# Patient Record
Sex: Female | Born: 1970 | Race: White | Hispanic: Yes | Marital: Married
Health system: Northeastern US, Academic
[De-identification: ages and names within clinical notes are randomized; demographics above are authoritative.]

---

## 2019-08-19 ENCOUNTER — Inpatient Hospital Stay: Admit: 2019-08-19 | Discharge: 2019-08-19 | Payer: MEDICAID

## 2019-08-19 DIAGNOSIS — Z20828 Contact with and (suspected) exposure to other viral communicable diseases: Secondary | ICD-10-CM

## 2019-08-19 DIAGNOSIS — Z20822 Contact with and (suspected) exposure to covid-19: Secondary | ICD-10-CM

## 2019-08-20 LAB — SARS COV-2 (COVID-19) RNA: BKR SARS-COV-2 RNA (COVID-19) (YH): NOT DETECTED

## 2019-09-19 ENCOUNTER — Ambulatory Visit: Admit: 2019-09-19 | Payer: MEDICAID

## 2019-09-26 ENCOUNTER — Ambulatory Visit: Admit: 2019-09-26 | Payer: MEDICAID

## 2020-11-19 ENCOUNTER — Emergency Department: Admit: 2020-11-19 | Payer: PRIVATE HEALTH INSURANCE

## 2020-11-19 ENCOUNTER — Inpatient Hospital Stay: Admit: 2020-11-19 | Discharge: 2020-11-19 | Payer: PRIVATE HEALTH INSURANCE | Attending: Emergency Medicine

## 2020-11-19 ENCOUNTER — Encounter: Admit: 2020-11-19 | Payer: PRIVATE HEALTH INSURANCE

## 2020-11-19 DIAGNOSIS — R0602 Shortness of breath: Secondary | ICD-10-CM

## 2020-11-19 LAB — TSH W/REFLEX TO FT4     (BH GH LMW Q YH): BKR THYROID STIMULATING HORMONE: 1.2 u[IU]/mL

## 2020-11-19 LAB — TROPONIN T HIGH SENSITIVITY, 3 HOUR (BH GH LMW YH)
BKR TROPONIN T HS 1 HOUR DELTA FROM 0 HOUR ON 3HR: 0 ng/L
BKR TROPONIN T HS 3 HOUR DELTA FROM 0 HOUR: 1 ng/L
BKR TROPONIN T HS 3 HOUR: 15 ng/L — ABNORMAL HIGH

## 2020-11-19 LAB — CBC WITH AUTO DIFFERENTIAL
BKR WAM ABSOLUTE IMMATURE GRANULOCYTES.: 0.02 x 1000/ÂµL (ref 0.00–0.30)
BKR WAM ABSOLUTE LYMPHOCYTE COUNT.: 1.93 x 1000/ÂµL (ref 0.60–3.70)
BKR WAM ABSOLUTE NRBC (2 DEC): 0 x 1000/ÂµL (ref 0.00–1.00)
BKR WAM ANALYZER ANC: 5.21 x 1000/ÂµL (ref 2.00–7.60)
BKR WAM BASOPHIL ABSOLUTE COUNT.: 0.05 x 1000/ÂµL (ref 0.00–1.00)
BKR WAM BASOPHILS: 0.6 % (ref 0.0–1.4)
BKR WAM EOSINOPHIL ABSOLUTE COUNT.: 0.21 x 1000/ÂµL (ref 0.00–1.00)
BKR WAM EOSINOPHILS: 2.7 % (ref 0.0–5.0)
BKR WAM HEMATOCRIT (2 DEC): 43.2 % (ref 35.00–45.00)
BKR WAM HEMOGLOBIN: 14.3 g/dL (ref 11.7–15.5)
BKR WAM IMMATURE GRANULOCYTES: 0.3 % (ref 0.0–1.0)
BKR WAM LYMPHOCYTES: 24.5 % (ref 17.0–50.0)
BKR WAM MCH (PG): 28.1 pg (ref 27.0–33.0)
BKR WAM MCHC: 33.1 g/dL (ref 31.0–36.0)
BKR WAM MCV: 85 fL (ref 80.0–100.0)
BKR WAM MONOCYTE ABSOLUTE COUNT.: 0.47 x 1000/ÂµL (ref 0.00–1.00)
BKR WAM MONOCYTES: 6 % (ref 4.0–12.0)
BKR WAM MPV: 9.6 fL (ref 8.0–12.0)
BKR WAM NEUTROPHILS: 65.9 % (ref 39.0–72.0)
BKR WAM NUCLEATED RED BLOOD CELLS: 0 % (ref 0.0–1.0)
BKR WAM PLATELETS: 396 x1000/ÂµL (ref 150–420)
BKR WAM RDW-CV: 14 % (ref 11.0–15.0)
BKR WAM RED BLOOD CELL COUNT.: 5.08 M/ÂµL (ref 4.00–6.00)
BKR WAM WHITE BLOOD CELL COUNT: 7.9 x1000/ÂµL (ref 4.0–11.0)

## 2020-11-19 LAB — COMPREHENSIVE METABOLIC PANEL
BKR A/G RATIO: 1.2 (ref 1.0–2.2)
BKR ALANINE AMINOTRANSFERASE (ALT): 28 U/L (ref 10–35)
BKR ALBUMIN: 4.4 g/dL (ref 3.6–4.9)
BKR ALKALINE PHOSPHATASE: 79 U/L (ref 9–122)
BKR ANION GAP: 14 (ref 7–17)
BKR ASPARTATE AMINOTRANSFERASE (AST): 31 U/L (ref 10–35)
BKR AST/ALT RATIO: 1.1
BKR BILIRUBIN TOTAL: 0.7 mg/dL (ref <=1.2)
BKR BLOOD UREA NITROGEN: 9 mg/dL (ref 6–20)
BKR BUN / CREAT RATIO: 15.8 (ref 8.0–23.0)
BKR CALCIUM: 9.8 mg/dL (ref 8.8–10.2)
BKR CHLORIDE: 101 mmol/L (ref 98–107)
BKR CO2: 22 mmol/L (ref 20–30)
BKR CREATININE: 0.57 mg/dL (ref 0.40–1.30)
BKR EGFR (AFR AMER): >60 mL/min/{1.73_m2} (ref >60)
BKR EGFR (NON AFRICAN AMERICAN): >60 mL/min/{1.73_m2} (ref >60)
BKR GLOBULIN: 3.6 g/dL — ABNORMAL HIGH (ref 2.3–3.5)
BKR GLUCOSE: 88 mg/dL (ref 70–100)
BKR POTASSIUM: 3.9 mmol/L (ref 3.3–5.3)
BKR PROTEIN TOTAL: 8 g/dL (ref 6.6–8.7)
BKR SODIUM: 137 mmol/L (ref 136–144)

## 2020-11-19 LAB — TROPONIN T HIGH SENSITIVITY, 1 HOUR WITH REFLEX (BH GH LMW YH)
BKR TROPONIN T HS 1 HOUR DELTA FROM 0 HOUR: 0 ng/L
BKR TROPONIN T HS 1 HOUR: 14 ng/L — ABNORMAL HIGH

## 2020-11-19 LAB — LYME ANTIBODIES W/RFLX TO CONFIRM (MODIFIED TWO-TIER TESTING)
BKR LYME TOTAL ANTIBODY INTERPRETATION: NEGATIVE
BKR LYME TOTAL ANTIBODY: 0.05 {index}

## 2020-11-19 LAB — TROPONIN T HIGH SENSITIVITY, 0 HOUR BASELINE WITH REFLEX (BH GH LMW YH): BKR TROPONIN T HS 0 HOUR BASELINE: 14 ng/L — ABNORMAL HIGH

## 2020-11-19 LAB — NT-PROBNPE: BKR B-TYPE NATRIURETIC PEPTIDE, PRO (PROBNP): 70.3 pg/mL (ref <125.0)

## 2020-11-19 LAB — PROTIME AND INR
BKR INR: 1.15 — ABNORMAL HIGH (ref 0.87–1.13)
BKR PROTHROMBIN TIME: 12 s (ref 9.5–12.1)

## 2020-11-19 LAB — MAGNESIUM: BKR MAGNESIUM: 1.9 mg/dL (ref 1.7–2.4)

## 2020-11-19 NOTE — ED Notes
2:40 PM Blood work drawn and sent to lab. No IV access. Per Dr Adine Madura, 1 hr trop not needed if baseline negative. Pt moved to [facility].

## 2020-11-19 NOTE — ED Notes
6:01 PM pt sent to [place]

## 2020-11-19 NOTE — ED Triage Note
1:36 PMPt was seen by me in ED triage. Pt will need further medical care. Initial studies ordered pending re-evaluation by the next ED medical provider.Here with co L facial pain now with R facial pain, atraumatic. Some sob without chest pain. gcs is 15. Speech and language are fluent. No ataxia. Gait is normal. No focal weakness or sensory defects.Riley Lam, MD RDMS FACEPAttending PhysicianDepartment of Emergency Medicine

## 2020-11-19 NOTE — ED Notes
2:51 PM Pt presents to ED with c/o SOB and facial numbness since October. Pt states she was dx with viral PNA in October and never got better. Pt reporting SOB on exertion, generalized body aches, facial numbness throughout face, facial pain on right side with blurred vision to right eye, and chest tightness. Pt rates pain 7/10. Pt denies N/V/D, fever/chills, CP. Pt alert, in no acute distress, airway patent, respirations equal and unlabored, skin warm and dry. Pending provider Lamont Dowdy, RN

## 2020-11-20 NOTE — ED Provider Notes
HistoryChief Complaint Patient presents with ? Shortness of Breath   SOB, worse on exertion. Also c/o facial numbness x November, pain to right side of face x 3 weeks.    HPI  this 50 year old female presents with ongoing shortness of breath without chest pain which has been present since she had an episode in October of 2021 while in [place] of a pneumonia that she was told was likely viral but was treated with antibiotics.  At that time it was thought that this was not a COVID pneumonia.  She has never really recovered from that episode.  At the present time she has shortness of breath along with a minimally productive cough which is worse when she has mild exertion.  She sleeps with 1 pillow and occasionally wakes up in the middle of the night short of breath.  She denies any fever or chills.  She does not have any chest pain.  She does not have any ankle swelling.  She also has some concomitant right facial pain that seems to be worse when she lies on her right face.  She denies any tooth pain.  She does have some pain inside her right posterior nostril.  She denies any visual symptoms.  She denies any fever or chills.  She has seen ENT for this without any resolution to her symptoms.  She does not smoke does not drink and has no known cardiovascular or pulmonary disease in her past medical history.  She does not currently have a local primary care physician since she has just moved back to this area recently.No past medical history on file.No past surgical history on file.No family history on file.Social History Socioeconomic History ? Marital status: Married ED Other Social History E-cigarette/Vaping Substances E-cigarette/Vaping Devices Review of SystemsA complete review of systems is negative except as noted in the history of present illness Physical ExamED Triage Vitals [11/19/20 1328]BP: (!) 135/96Pulse: (!) 95Pulse from  O2 sat: n/aResp: 20Temp: 97.6 ?F (36.4 ?C)Temp src: TemporalSpO2: 99 % BP 136/85  - Pulse 89  - Temp 97.6 ?F (36.4 ?C) (Temporal)  - Resp 20  - Wt 79.4 kg (175 lb 0.7 oz)  - SpO2 99% Physical Exam  general-no acute distress, awake alert and oriented.  Vital signs are noted to be completely within normal limits.  Patient is speaking clear [place] and in full sentences without any obvious respiratory distress.  Head-atraumatic and normocephalic.  Eyes-PERL, EOMI.  Face is symmetrical.  There is some mild tenderness to percussion over the right malar area.  The nose is normal.  The mouth is normal without any redness or tooth percussion tenderness.  Neck half-no gym JVD or lymphadenopathy.  Chest-clear to auscultation except for some left mid posterior chest fine rales.  There is no wheezing.  Heart-regular heart sounds without murmurs rubs or gallops.  Abdomen-soft nontender no organomegaly.  Extremities-no ankle edema no calf tenderness.ProceduresProcedures ED COURSEInterpreted by ED Provider: labs, ECG, x-ray, [place] scan and pulse oximetryECG interpreted by attending? interpreted by ED physicianRate: normalRhythm: sinus rhythm.ECG compared to previous: not compared with previous ECG.S-T segments: normal.Interpretation: normal ECG.Patient Reevaluation: 50 year old female presents with a 9 month history of continuing shortness of breath especially with mild exertion following a episode in October 2021 of a pneumonia that was diagnosed as viral but treated as a bacterial with antibiotics.  She has no chest pain with this.  She has no fever chills.  She does have a mild cough.  She also has some pain in  her right malar area that is worse when she lies on it.  She has a relatively benign examination with normal vital signs but some tenderness over the right malar area without redness as well as some left mid lung field inspiratory rales.  She shows no other signs of any pulmonary edema which is what I believe that she has clinically.  Her chest x-ray shows her to have some mild pulmonary edema without any cardiac enlargement.  Her CBC is normal.  Clinically I believe that her next steps would be to see a pulmonologist as well as a cardiologist to better elucidate the cause for her symptoms which I believe are likely secondary to the viral pneumonia she had 9 months ago.The Bernie scan of the head and neck which were ordered at triage are still not done.  The troponins which were ordered are both at 14 and so do not show any acute cardiac ischemia.  I believe that this patient does require referral to a pulmonologist and to a cardiologist upon discharge.  I will plan to pass this patient on to [facility].  The proBNP is also pending at this time.Clinical Impressions as of 11/19/20 2221 Shortness of breath  ED DispositionDischarge Berton Lan, MD07/16/22 1555 Berton Lan, MD07/16/22 1659Douglas Sharalyn Ink, MD:  Received in sign-out.  [place] scan unremarkable.  Patient discharged home. Juliann Mule, MD07/16/22 2222

## 2020-11-21 ENCOUNTER — Telehealth: Admit: 2020-11-21 | Payer: PRIVATE HEALTH INSURANCE

## 2020-11-21 NOTE — Telephone Encounter
Patient saw [facility] PCP 2021. That office most recently reached out to patient in may for appt.Has patient contacted this office?Per protocol a primary care provider must refer to speciality. ED can refer only when speciality consulted while patient was in ED and recommended the outpatient follow up.

## 2020-11-21 NOTE — Telephone Encounter
Niece Leta Jungling [phone]) is calling looking for an ED follow up for the following. Has medical insurance, states does not have a primary Dr. No referral entered. States her breathing is stable as long as she stays in the bed. Please advise if she can be scheduled?Follow-Ups	Shortness of breathClinical impression 	Shortness of Breath  1	Follow up with [facility] Emergency Department (Emergency Medicine); As needed2	Schedule an appointment with [facility] Primary Care Cardiac (Cardiovascular Disease) in 2 days (11/21/2020)3	Schedule an appointment with [facility] Pulmonary (Pulmonary Disease) in 2 days (11/21/2020

## 2021-03-12 ENCOUNTER — Inpatient Hospital Stay: Admit: 2021-03-12 | Discharge: 2021-03-12 | Payer: PRIVATE HEALTH INSURANCE

## 2021-03-12 ENCOUNTER — Emergency Department: Admit: 2021-03-12 | Payer: PRIVATE HEALTH INSURANCE

## 2021-03-12 DIAGNOSIS — R059 Cough, unspecified: Secondary | ICD-10-CM

## 2021-03-12 DIAGNOSIS — R0789 Other chest pain: Secondary | ICD-10-CM

## 2021-03-12 DIAGNOSIS — R0602 Shortness of breath: Secondary | ICD-10-CM

## 2021-03-12 LAB — BASIC METABOLIC PANEL
BKR ANION GAP: 11 (ref 7–17)
BKR BLOOD UREA NITROGEN: 9 mg/dL (ref 6–20)
BKR BUN / CREAT RATIO: 15.8 (ref 8.0–23.0)
BKR CALCIUM: 9.5 mg/dL (ref 8.8–10.2)
BKR CHLORIDE: 104 mmol/L (ref 98–107)
BKR CO2: 24 mmol/L (ref 20–30)
BKR CREATININE: 0.57 mg/dL (ref 0.40–1.30)
BKR EGFR, CREATININE (CKD-EPI 2021): >60 mL/min/{1.73_m2} (ref >=60)
BKR GLUCOSE: 80 mg/dL (ref 70–100)
BKR POTASSIUM: 4.1 mmol/L (ref 3.3–5.3)
BKR SODIUM: 139 mmol/L (ref 136–144)

## 2021-03-12 LAB — CBC WITH AUTO DIFFERENTIAL
BKR WAM ABSOLUTE IMMATURE GRANULOCYTES.: 0.02 x 1000/ÂµL (ref 0.00–0.30)
BKR WAM ABSOLUTE LYMPHOCYTE COUNT.: 1.82 x 1000/ÂµL (ref 0.60–3.70)
BKR WAM ABSOLUTE NRBC (2 DEC): 0 x 1000/ÂµL (ref 0.00–1.00)
BKR WAM ANALYZER ANC: 3.78 x 1000/ÂµL (ref 2.00–7.60)
BKR WAM BASOPHIL ABSOLUTE COUNT.: 0.06 x 1000/ÂµL (ref 0.00–1.00)
BKR WAM BASOPHILS: 0.9 % (ref 0.0–1.4)
BKR WAM EOSINOPHIL ABSOLUTE COUNT.: 0.28 x 1000/ÂµL (ref 0.00–1.00)
BKR WAM EOSINOPHILS: 4.4 % (ref 0.0–5.0)
BKR WAM HEMATOCRIT (2 DEC): 42.1 % (ref 35.00–45.00)
BKR WAM HEMOGLOBIN: 13.5 g/dL (ref 11.7–15.5)
BKR WAM IMMATURE GRANULOCYTES: 0.3 % (ref 0.0–1.0)
BKR WAM LYMPHOCYTES: 28.3 % (ref 17.0–50.0)
BKR WAM MCH (PG): 27.1 pg (ref 27.0–33.0)
BKR WAM MCHC: 32.1 g/dL (ref 31.0–36.0)
BKR WAM MCV: 84.5 fL (ref 80.0–100.0)
BKR WAM MONOCYTE ABSOLUTE COUNT.: 0.47 x 1000/ÂµL (ref 0.00–1.00)
BKR WAM MONOCYTES: 7.3 % (ref 4.0–12.0)
BKR WAM MPV: 9.8 fL (ref 8.0–12.0)
BKR WAM NEUTROPHILS: 58.8 % (ref 39.0–72.0)
BKR WAM NUCLEATED RED BLOOD CELLS: 0 % (ref 0.0–1.0)
BKR WAM PLATELETS: 410 x1000/ÂµL (ref 150–420)
BKR WAM RDW-CV: 14.8 % (ref 11.0–15.0)
BKR WAM RED BLOOD CELL COUNT.: 4.98 M/ÂµL (ref 4.00–6.00)
BKR WAM WHITE BLOOD CELL COUNT: 6.4 x1000/ÂµL (ref 4.0–11.0)

## 2021-03-12 LAB — INFLUENZA A+B/RSV BY RT-PCR
BKR INFLUENZA A: NEGATIVE
BKR INFLUENZA B: NEGATIVE
BKR RESPIRATORY SYNCYTIAL VIRUS: NEGATIVE

## 2021-03-12 LAB — TROPONIN T HIGH SENSITIVITY, 0 HOUR BASELINE WITH REFLEX (BH GH LMW YH): BKR TROPONIN T HS 0 HOUR BASELINE: 16 ng/L — ABNORMAL HIGH

## 2021-03-12 LAB — NT-PROBNPE: BKR B-TYPE NATRIURETIC PEPTIDE, PRO (PROBNP): 56 pg/mL (ref <125.0)

## 2021-03-12 LAB — SARS COV-2 (COVID-19) RNA: BKR SARS-COV-2 RNA (COVID-19) (YH): POSITIVE — AB

## 2021-03-12 LAB — D-DIMER, QUANTITATIVE: BKR D-DIMER: 1.78 mg{FEU}/L — ABNORMAL HIGH (ref <=0.50)

## 2021-03-12 MED ORDER — IOHEXOL 350 MG IODINE/ML INTRAVENOUS SOLUTION
350 mg iodine/mL | Freq: Once | INTRAVENOUS | Status: CP | PRN
Start: 2021-03-12 — End: ?
  Administered 2021-03-12: 22:00:00 350 mL via INTRAVENOUS

## 2021-03-12 MED ORDER — SODIUM CHLORIDE 0.9 % LARGE VOLUME SYRINGE FOR AUTOINJECTOR
Freq: Once | INTRAVENOUS | Status: CP | PRN
Start: 2021-03-12 — End: ?
  Administered 2021-03-12: 22:00:00 via INTRAVENOUS

## 2021-03-12 MED ORDER — ALBUTEROL SULFATE HFA 90 MCG/ACTUATION AEROSOL INHALER
90 mcg/actuation | Freq: Four times a day (QID) | RESPIRATORY_TRACT | Status: CP | PRN
Start: 2021-03-12 — End: ?
  Administered 2021-03-13: 01:00:00 90 mcg/actuation via RESPIRATORY_TRACT

## 2021-03-12 MED ORDER — METHYLPREDNISOLONE SOD SUCC (PF) 125 MG/2 ML SOLUTION FOR INJECTION
125 mg/2 mL | Freq: Once | INTRAVENOUS | Status: CP
Start: 2021-03-12 — End: ?
  Administered 2021-03-12: 18:00:00 125 mL via INTRAVENOUS

## 2021-03-12 MED ORDER — IPRATROPIUM 0.5 MG-ALBUTEROL 3 MG (2.5 MG BASE)/3 ML NEBULIZATION SOLN
0.5 mg-3 mg(2.5 mg base)/3 mL | Freq: Once | RESPIRATORY_TRACT | Status: CP
Start: 2021-03-12 — End: ?
  Administered 2021-03-12: 18:00:00 0.5 mL via RESPIRATORY_TRACT

## 2021-03-12 MED ORDER — MOLNUPIRAVIR (EUA) CAPSULE (ED TAKE HOME)
Status: CP
Start: 2021-03-12 — End: ?

## 2021-03-12 MED ORDER — SODIUM CHLORIDE 0.9 % BOLUS (NEW BAG)
0.9 % | Freq: Once | INTRAVENOUS | Status: CP
Start: 2021-03-12 — End: ?
  Administered 2021-03-12: 18:00:00 0.9 mL/h via INTRAVENOUS

## 2021-03-12 MED ORDER — PREDNISONE 50 MG TABLET
50 mg | ORAL_TABLET | Freq: Every day | ORAL | 1 refills | Status: AC
Start: 2021-03-12 — End: ?

## 2021-03-12 MED ORDER — MOLNUPIRAVIR 200 MG CAPSULE (EUA)
200 mg | ORAL_CAPSULE | Freq: Two times a day (BID) | ORAL | 1 refills | Status: AC
Start: 2021-03-12 — End: ?

## 2021-03-13 DIAGNOSIS — E669 Obesity, unspecified: Secondary | ICD-10-CM

## 2021-03-13 DIAGNOSIS — R0789 Other chest pain: Secondary | ICD-10-CM

## 2021-03-13 DIAGNOSIS — E785 Hyperlipidemia, unspecified: Secondary | ICD-10-CM

## 2021-03-13 DIAGNOSIS — U071 COVID-19: Principal | ICD-10-CM

## 2021-03-13 DIAGNOSIS — Z6833 Body mass index (BMI) 33.0-33.9, adult: Secondary | ICD-10-CM

## 2021-03-13 DIAGNOSIS — Z8616 Personal history of COVID-19: Secondary | ICD-10-CM

## 2021-03-13 LAB — TROPONIN T HIGH SENSITIVITY, 1 HOUR WITH REFLEX (BH GH LMW YH)
BKR TROPONIN T HS 1 HOUR DELTA FROM 0 HOUR: -7 ng/L
BKR TROPONIN T HS 1 HOUR: 9 ng/L

## 2021-03-13 NOTE — Telephone Encounter
Message from ED provider requesting pt to be called to see how she is feeling and assure pulmonary follow up. Patient has an apt with her PCP in 2 days and will have her send referral as ED referral was not accepted. She was appreciative of the follow up call and states she will definitely follow up with pulmonary.

## 2021-03-13 NOTE — ED Notes
8:29 PM dc in stable condition with husband by PA. Noted to ambulate without any difficulty. Was given to go meds by previous RN and given albuterol inhaler to go by me.

## 2021-03-13 NOTE — Discharge Instructions
Discharge Instructions for Teresa Watson:You were seen at [facility] Emergency Department on 03/12/2021 for shortness of breath, chest tightness.If you were prescribed any medications, be sure to read the medication insert and call your primary care doctor or the emergency department with any questions or concerns.No adjustments were made to your home medication regimen.You will need to follow up with your primary care doctor within a week.  Call your doctor's office as soon as possible to schedule an appointment and to let your doctor know that your condition required a visit to the Emergency Room.Return to the emergency department immediately if you develop any of the following symptoms:New or worsening chest pain and/or shortness of breathConfusion or seizuresDizziness or loss of consciousnessFever above 103 degrees that you cannot control with over-the-counter medication (such as ibuprofen or Tylenol)Excruciating pain, numbness, or weaknessUncontrollable vomitingWorsening of your current symptomsThoughts of suicide or of hurting yourself or anyone elseAny other symptoms that you find concerningIf you have any questions about the care you received or what to do next, please call the emergency department follow-up nurse at one of the following numbers. There are follow-up nurses available Monday through Friday, from 7:00 a.m. until 3:30 p.m. to answer any questions you have after you have been discharged.[facility]: [phone] [place] [place] [facility]: [phone] [facility]: [phone] Hospital: [phone] you need a primary care physician, the following physicians may be accepting new patients; please call to see if an appointment is available: [facility]: [address] [place], [place] - [phone] [address] [address], [place], [place] - [phone] [facility] [place], [place], [place] - [address] [place], [place] - [phone] [facility] [address], [place], [place] - [phone], [place], [place] - [phone] [facility] [address] [place], [place] - [phone] [address] [place], [place] - [phone] [facility] (dentistry [address] [place], [place] - [phone] [facility] [phone] (call this number and a receptionist will help connect you with an appropriate provider; you can also call this number to get matched with a specialist, if applicable)[facility] GroupMultiple locations - [phone] also be booked online: [employer] can also use the Find a Doctor tool on the [facility] website:[email or web address] -> Find a Doctor -> Specialty: Primary Care/Internal Medicine -> Click Accepting New Patients -> Search If applicable and available at the time of your discharge, a copy of your laboratory tests and imaging reports have been included in this packet. Some of your results may also be available on MyChart, if you are enrolled; if you are not enrolled in MyChart, a temporary access code is included in this packet so you can set up your account. You can also obtain any pending results by phone once they are available by calling the follow-up nurse at the number above. However, if you would like to request copies of the original images or any other medical records, you may do so by calling [phone] (8:00 a.m. to 4:30 p.m., Monday through Friday). You may need to fill out authorization paperwork and/or pick up the images. There may also be a printing fee for some records. Thank you for trusting your care to [place].  Do not hesitate to return if you feel the need.

## 2021-03-26 NOTE — ED Provider Notes
HistoryChief Complaint Patient presents with ? Shortness of Breath  50 year old female history of hyperlipidemia coming to the emergency department with worsening shortness of breath on exertion and chest tightness over the last 2 days, patient states she has been experiencing the symptoms for the last 1 year ever since she had a viral pneumonia that was presumed to be COVID at the time.  Patient was told she should be seen by a pulmonologist but had moved to [place] and was not seen- during her time in [place] she was evaluated and referred to a cardiologist who did a echo and stress test 2 months ago and she was told that for her age everything appeared to be normal.  Patient now works remotely and has moved back to [place] in order to be seen by pulmonology and establish herself with a PMD. Arrived back in [place] on Friday which is when her symptoms worsened, patient states for the last 1 year she has not been able to walk more than 1 block due to shortness of breath and chest tightness, sleeps with several pillows on her side, if she tries to sleep flat she is too short of breath.  Wakes up at times coughing, sometimes with yellow sputum, does not wake up with chest tightness. Reports about 6 months of joint pains to her wrists and ankles, sometimes with swelling at the ankles, has not been seen by a rheumatologist or had an autoimmune workup done yetThe history is provided by the patient. No language interpreter was used. Shortness of BreathTiming:  IntermittentProgression:  WorseningChronicity:  RecurrentContext: activity and URI  Relieved by:  RestWorsened by:  Activity and exertionIneffective treatments:  None triedAssociated symptoms: chest pain, cough and sputum production  Associated symptoms: no abdominal pain, no claudication, no diaphoresis, no ear pain, no fever, no headaches, no hemoptysis, no neck pain, no PND, no rash, no sore throat, no syncope, no swollen glands, no vomiting and no wheezing  Risk factors: obesity  Risk factors: no recent alcohol use, no family hx of DVT, no hx of cancer, no hx of PE/DVT, no oral contraceptive use, no prolonged immobilization, no recent surgery and no tobacco use   No past medical history on file.No past surgical history on file.No family history on file.Social History Socioeconomic History ? Marital status: Married ED Other Social History E-cigarette/Vaping Substances E-cigarette/Vaping Devices Review of Systems Constitutional: Negative for diaphoresis and fever. HENT: Negative for ear pain and sore throat.  Respiratory: Positive for cough, sputum production and shortness of breath. Negative for hemoptysis and wheezing.  Cardiovascular: Positive for chest pain. Negative for claudication, syncope and PND. Gastrointestinal: Negative for abdominal pain and vomiting. Musculoskeletal: Negative for neck pain. Skin: Negative for rash. Neurological: Negative for headaches. All other systems reviewed and are negative. Physical ExamED Triage Vitals [03/12/21 1143]BP: (!) 137/95Pulse: (!) 96Pulse from  O2 sat: n/aResp: (!) 23Temp: 98.3 ?F (36.8 ?C)Temp src: OralSpO2: 98 % BP (!) 148/79  - Pulse (!) 92  - Temp 98.1 ?F (36.7 ?C)  - Resp 16  - Ht 5' (1.524 m)  - Wt 77.1 kg  - SpO2 98%  - BMI 33.20 kg/m? Physical ExamVitals and nursing note reviewed. Constitutional:     General: She is not in acute distress.   Appearance: Normal appearance. She is normal weight. She is not ill-appearing, toxic-appearing or diaphoretic. HENT:    Head: Normocephalic and atraumatic.    Right Ear: External ear normal.    Left Ear: External ear normal.  Nose: Nose normal.    Mouth/Throat:    Mouth: Mucous membranes are moist.    Pharynx: Oropharynx is clear. Eyes:    Conjunctiva/sclera: Conjunctivae normal. Cardiovascular: Rate and Rhythm: Normal rate and regular rhythm.    Pulses: Normal pulses.    Heart sounds: Normal heart sounds. No murmur heard.  No friction rub. No gallop. Pulmonary:    Effort: Pulmonary effort is normal. No respiratory distress.    Breath sounds: No stridor. Wheezing and rales present. No rhonchi.    Comments: Scant intermittent wheezing noted on R mid lungRales noted at bilat bases Chest:    Chest wall: No tenderness. Abdominal:    General: Abdomen is flat. Bowel sounds are normal. There is no distension.    Palpations: Abdomen is soft.    Tenderness: There is no abdominal tenderness. There is no right CVA tenderness or left CVA tenderness. Musculoskeletal:       General: No swelling or tenderness. Normal range of motion.    Cervical back: Normal range of motion and neck supple.    Right lower leg: No edema.    Left lower leg: No edema.    Comments: dp pulses 2+ bilat  Skin:   General: Skin is warm and dry.    Capillary Refill: Capillary refill takes less than 2 seconds. Neurological:    General: No focal deficit present.    Mental Status: She is alert. Psychiatric:       Mood and Affect: Mood normal.       Behavior: Behavior normal.  ProceduresProceduresResident/APP ZOX:[id number] & Plan50 year old female here with ongoing dyspnea on exertion with chest tightness worse over the last 2 days, on exam with scant wheezing and rales at bases, ambulatory SaO2 96%Ddx:  CHF, ACS, pneumonia, viral illness/COVID, PEP:  Labs, EKG, CXR,Dimer positive, will obtain [place] PE studyDr. Modesto Charon avail for consultation At discharge patient aware she is COVID positive, gave very strict return precautions, sent prednisone to her pharmacy, albuterol pump handed to patient at discharge, referrals placed for pulmonology and internal medicineED COURSEPatient Reevaluation: ED Attestation:PA/APRN: Face to face evaluation was performed by me in collaboration with the Advanced Practice Provider to assess for significant health threats. I personally performed substantive history, exam, and/or MDMOn evaluation:50 year old female no known prior medical history (except for mild hyperlipidemia) reporting months of intermittent chest tightness and shortness of breath, worsened over the past 2 days along with coughing and wheezing; nonsmoker no known history of asthma but has had issues since COVID infection in 2020.  States that she recently moved back from [place] has not established PCP.  Noted to have diffuse scant wheezing throughout and some rales in the right lung base.  Likely has some previously undiagnosed asthma along with reactive airway disease and viral URI, given age and risk factors will obtain troponin, D-dimer, viral swab, albuterol and Solu-Medrol, if workup negative likely discharge with outpatient PCP follow-up and steroid courseFor continuity of care, Denae Zulueta is being transitioned to the emergency annex for care by APP.Sinclair Grooms WongClinical Impressions as of 03/12/21 2013 COVID  ED DispositionDischarge McNiff, Lucilla Lame, PA11/06/22 1345 McNiff, Lucilla Lame, PA11/06/22 1648 McNiff, Lucilla Lame, PA11/06/22 2013 Britta Mccreedy, MD11/19/22 2300

## 2021-03-29 ENCOUNTER — Inpatient Hospital Stay: Admit: 2021-03-29 | Discharge: 2021-03-29 | Payer: PRIVATE HEALTH INSURANCE

## 2021-03-29 DIAGNOSIS — U071 COVID-19: Secondary | ICD-10-CM

## 2021-03-29 DIAGNOSIS — J849 Interstitial pulmonary disease, unspecified: Secondary | ICD-10-CM

## 2021-03-29 LAB — RHEUMATOID FACTOR: BKR RHEUMATOID FACTOR: <10 IU/mL (ref <14.0)

## 2021-03-29 LAB — SEDIMENTATION RATE (ESR): BKR SEDIMENTATION RATE, ERYTHROCYTE: 72 mm/hr — ABNORMAL HIGH (ref 0–20)

## 2021-03-29 MED ORDER — ALBUTEROL INHL
RESPIRATORY_TRACT | 0.00 refills | 8.00000 days | Status: AC | PRN
Start: 2021-03-29 — End: 2021-06-27

## 2021-04-03 LAB — ANA TITER AND PATTERN W/RFLX   (BH GH): BKR ANA TITER: 1:1280 {titer} — AB

## 2021-04-03 LAB — ANA IFA SCREEN W/REFL TO TITER AND PATTERN, IFA: BKR ANTI-NUCLEAR ANTIBODY (ANA): POSITIVE — AB

## 2021-04-05 LAB — DNA ANTIBODY, DOUBLE-STRANDED: BKR DSDNA ANTIBODY: <12.3 IU/mL (ref <30)

## 2021-04-06 ENCOUNTER — Ambulatory Visit: Admit: 2021-04-06 | Payer: PRIVATE HEALTH INSURANCE

## 2021-04-06 ENCOUNTER — Inpatient Hospital Stay: Admit: 2021-04-06 | Discharge: 2021-04-06 | Payer: PRIVATE HEALTH INSURANCE

## 2021-04-06 ENCOUNTER — Encounter: Admit: 2021-04-06 | Payer: PRIVATE HEALTH INSURANCE

## 2021-04-06 DIAGNOSIS — M2550 Pain in unspecified joint: Secondary | ICD-10-CM

## 2021-04-06 DIAGNOSIS — R768 Other specified abnormal immunological findings in serum: Secondary | ICD-10-CM

## 2021-04-06 DIAGNOSIS — J849 Interstitial pulmonary disease, unspecified: Secondary | ICD-10-CM

## 2021-04-06 DIAGNOSIS — M609 Myositis, unspecified: Secondary | ICD-10-CM

## 2021-04-06 DIAGNOSIS — R0602 Shortness of breath: Secondary | ICD-10-CM

## 2021-04-06 DIAGNOSIS — R700 Elevated erythrocyte sedimentation rate: Secondary | ICD-10-CM

## 2021-04-06 LAB — SEDIMENTATION RATE (ESR): BKR SEDIMENTATION RATE, ERYTHROCYTE: 66 mm/hr — ABNORMAL HIGH (ref 0–20)

## 2021-04-06 LAB — C-REACTIVE PROTEIN     (CRP): BKR C-REACTIVE PROTEIN, HIGH SENSITIVITY: 6.1 mg/L — ABNORMAL HIGH

## 2021-04-06 MED ORDER — PREDNISONE 10 MG TABLET
10 | ORAL_TABLET | Freq: Every day | ORAL | 1 refills | 12.00000 days | Status: AC
Start: 2021-04-06 — End: ?

## 2021-04-07 LAB — ANCA SCREEN, WITH REFLEX TO TITER: BKR ANCA SCREEN: NEGATIVE

## 2021-04-07 LAB — C3 + C4 COMPLEMENT COMPONENT     (BH GH Q)
BKR C3 COMPLEMENT: 156 mg/dL (ref 90–180)
BKR C4 COMPLEMENT: 33 mg/dL (ref 10–40)

## 2021-04-07 LAB — CK     (BH GH L LMW YH): BKR CREATINE KINASE TOTAL: 77 U/L (ref 11–204)

## 2021-04-07 LAB — ANA TITER AND PATTERN W/RFLX   (BH GH): BKR ANA TITER: 1:1280 {titer} — AB

## 2021-04-07 LAB — ANGIOTENSIN CONVERTING ENZYME: BKR ANGIOTENSIN CONVERTING ENZYME: 57 U/L (ref 12–82)

## 2021-04-07 LAB — RHEUMATOID FACTOR: BKR RHEUMATOID FACTOR: <10 IU/mL (ref <14.0)

## 2021-04-07 LAB — ANA IFA SCREEN W/REFL TO TITER AND PATTERN, IFA: BKR ANTI-NUCLEAR ANTIBODY (ANA): POSITIVE — AB

## 2021-04-08 LAB — CYCLIC CITRUL PEPTIDE ANTIBODY, IGG: BKR CCP AB, IGG: 0.8 EliA U/mL (ref <7.0)

## 2021-04-08 LAB — BETA-2 GLYCOPROTEIN 1 ANTIBODY, IGG: BKR B2G IGG: 1 U/mL (ref <7.0)

## 2021-04-08 LAB — DNA ANTIBODY, DOUBLE-STRANDED: BKR DSDNA ANTIBODY: <12.3 IU/mL (ref <30)

## 2021-04-09 LAB — CENTROMERE B ANTIBODY     (BH LMW Q): CENTROMERE B ANTIBODY: <1

## 2021-04-09 LAB — PROTEINASE 3 ANTIBODY: PROTEINASE-3 ANTIBODY: <1 AI (ref <1.0)

## 2021-04-09 LAB — MYELOPEROXIDASE ANTIBODY: MYELOPEROXIDASE AB: <1 AI (ref <1.0)

## 2021-04-10 LAB — SCLERODERMA (SCL-70) ANTIBODY: SCL-70 ANTIBODY: <1

## 2021-04-10 LAB — SJOGRENS AB, SS-A     (BH GH LMW Q YH): SJOGREN'S ANTIBODY (SS-A): >8 — ABNORMAL HIGH

## 2021-04-10 LAB — SMITH/RNP ANTIBODY (BH GH L Q): SM/RNP ANTIBODY: <1

## 2021-04-18 LAB — RNA POLYMERASE III ANTIBODY: RNA POLYMERASE III ANTIBODY, IGG: <20 Units (ref <20)

## 2021-04-18 LAB — TPMT ACTIVITY     (BH LMW Q): TPMT ACTIVITY: 16

## 2021-04-18 MED ORDER — BIOTIN 1,000 MCG CHEWABLE TABLET
1000 | ORAL | 0.00 refills | 60.00000 days | Status: AC
Start: 2021-04-18 — End: 2021-04-18

## 2021-04-18 MED ORDER — PANTOPRAZOLE 20 MG TABLET,DELAYED RELEASE
20 | Freq: Every day | ORAL | 2.00 refills | 90.00000 days | Status: AC
Start: 2021-04-18 — End: 2021-04-18

## 2021-04-18 MED ORDER — FLUTICASONE PROPIONATE 50 MCG/ACTUATION NASAL SPRAY,SUSPENSION
50 | NASAL | 3.00 refills | 30.00000 days | Status: AC
Start: 2021-04-18 — End: 2021-04-18

## 2021-04-18 MED ORDER — MULTI FOR HER ORAL
ORAL | 0.00 refills | 1.00000 days | Status: AC
Start: 2021-04-18 — End: ?

## 2021-04-18 MED ORDER — LEVONORGESTREL 21 MCG/24 HR (UP TO 8 YEARS) 52 MG INTRAUTERINE DEVICE
21 | Freq: Once | INTRAUTERINE | 0.00 refills | 84.00000 days | Status: AC
Start: 2021-04-18 — End: ?

## 2021-04-18 MED ORDER — LORATADINE 10 MG TABLET
10 | ORAL | 0.00 refills | 75.00000 days | Status: AC
Start: 2021-04-18 — End: 2021-04-18

## 2021-04-18 MED ORDER — VITAMIN E 268 MG (400 UNIT) CAPSULE
400 | Freq: Every day | ORAL | 0.00 refills | Status: AC
Start: 2021-04-18 — End: ?

## 2021-04-18 MED ORDER — IPRATROPIUM-ALBUTEROL INHL
RESPIRATORY_TRACT | 3.00 refills | 20.00000 days | Status: AC
Start: 2021-04-18 — End: 2021-04-18

## 2021-04-18 MED ORDER — ERGOCALCIFEROL (VITAMIN D2) 1,250 MCG (50,000 UNIT) CAPSULE
1250 | ORAL | 1.00 refills | 84.00000 days | Status: AC
Start: 2021-04-18 — End: 2021-06-20

## 2021-04-18 MED ORDER — FLANAX ANTACID ORAL
ORAL | 1.00 refills | 20.00000 days | Status: AC | PRN
Start: 2021-04-18 — End: 2021-05-04

## 2021-04-18 MED ORDER — HOME OXYGEN - OUTPATIENT
NASAL | 0.00 refills | Status: AC
Start: 2021-04-18 — End: ?

## 2021-04-18 MED ORDER — FISH OIL 120 MG-180 MG-60 MG-1,200 MG CAPSULE,DELAYED RELEASE
120 | ORAL | 0.00 refills | 90.00000 days | Status: AC
Start: 2021-04-18 — End: 2021-11-10

## 2021-04-19 ENCOUNTER — Inpatient Hospital Stay: Admit: 2021-04-19 | Discharge: 2021-04-19 | Payer: PRIVATE HEALTH INSURANCE

## 2021-04-19 DIAGNOSIS — Z114 Encounter for screening for human immunodeficiency virus [HIV]: Secondary | ICD-10-CM

## 2021-04-19 DIAGNOSIS — Z1159 Encounter for screening for other viral diseases: Secondary | ICD-10-CM

## 2021-04-19 DIAGNOSIS — Z0000 Encounter for general adult medical examination without abnormal findings: Secondary | ICD-10-CM

## 2021-04-19 DIAGNOSIS — E559 Vitamin D deficiency, unspecified: Secondary | ICD-10-CM

## 2021-04-19 LAB — URINE MICROSCOPIC     (BH GH LMW YH)
BKR RBC/HPF INSTRUMENT: 1 /HPF (ref 0–2)
BKR URINE SQUAMOUS EPITHELIAL CELLS, UA (NUMERIC): 16 /HPF — ABNORMAL HIGH (ref 0–5)
BKR WAM LYMPHOCYTES: 16 /HPF — ABNORMAL HIGH (ref 0–5)
BKR WAM MPV: 2 /HPF (ref 0–5)
BKR WBC/HPF INSTRUMENT: 2 /HPF (ref 0–5)

## 2021-04-19 LAB — URINALYSIS-MACROSCOPIC W/REFLEX MICROSCOPIC
BKR BILIRUBIN, UA: NEGATIVE
BKR BLOOD, UA: NEGATIVE M/??L (ref 4.00–6.00)
BKR GLUCOSE, UA: NEGATIVE
BKR KETONES, UA: NEGATIVE
BKR LEUKOCYTE ESTERASE, UA: NEGATIVE mmol/L (ref 3.3–5.3)
BKR NITRITE, UA: NEGATIVE
BKR PH, UA: 5.5 (ref 5.5–7.5)
BKR SPECIFIC GRAVITY, UA: 1.023 (ref 1.005–1.030)
BKR UROBILINOGEN, UA (MG/DL): <2 mg/dL (ref <=2.0)

## 2021-04-19 LAB — COMPREHENSIVE METABOLIC PANEL
BKR A/G RATIO: 1.6 x 1000/??L (ref 1.0–2.2)
BKR ALANINE AMINOTRANSFERASE (ALT): 19 U/L (ref 10–35)
BKR ALKALINE PHOSPHATASE: 79 U/L (ref 9–122)
BKR ASPARTATE AMINOTRANSFERASE (AST): 17 U/L (ref 10–35)
BKR AST/ALT RATIO: 0.9
BKR BILIRUBIN TOTAL: 0.6 mg/dL (ref <=1.2)
BKR BLOOD UREA NITROGEN: 10 mg/dL (ref 6–20)
BKR BUN / CREAT RATIO: 16.1 (ref 8.0–23.0)
BKR CALCIUM: 9.2 mg/dL (ref 8.8–10.2)
BKR CHLORIDE: 103 mmol/L (ref 98–107)
BKR CO2: 26 mmol/L (ref 20–30)
BKR CREATININE: 0.62 mg/dL (ref 0.40–1.30)
BKR EGFR, CREATININE (CKD-EPI 2021): >60 mL/min/{1.73_m2} (ref >=60)
BKR GLOBULIN: 2.8 g/dL (ref 2.3–3.5)
BKR GLUCOSE: 105 mg/dL — ABNORMAL HIGH (ref 70–100)
BKR POTASSIUM: 4.3 mmol/L (ref 3.3–5.3)
BKR PROTEIN TOTAL: 7.2 g/dL (ref 6.6–8.7)
BKR SODIUM: 138 mmol/L (ref 136–144)
BKR WAM BASOPHILS: 0.6 mg/dL (ref 0.0–<=1.2)
BKR WAM EOSINOPHIL ABSOLUTE COUNT.: 0.9 x 1000/??L (ref 0.00–1.00)

## 2021-04-19 LAB — FERRITIN
BKR ANION GAP: 285 ng/mL — ABNORMAL HIGH (ref 13–150)
BKR FERRITIN: 285 ng/mL — ABNORMAL HIGH (ref 13–150)

## 2021-04-19 LAB — HEMOGLOBIN A1C
BKR ESTIMATED AVERAGE GLUCOSE: 117 mg/dL
BKR HEMOGLOBIN A1C: 5.7 % — ABNORMAL HIGH (ref 4.0–5.6)

## 2021-04-19 LAB — CBC WITH AUTO DIFFERENTIAL
BKR ALBUMIN: 1.1 % (ref 0.0–5.0)
BKR WAM ABSOLUTE IMMATURE GRANULOCYTES.: 0.02 x 1000/??L (ref 0.00–0.30)
BKR WAM ABSOLUTE LYMPHOCYTE COUNT.: 2.4 x 1000/??L (ref 0.60–3.70)
BKR WAM ABSOLUTE NRBC (2 DEC): 0 x 1000/??L (ref 0.00–1.00)
BKR WAM ANALYZER ANC: 5.38 x 1000/??L (ref 2.00–7.60)
BKR WAM BASOPHIL ABSOLUTE COUNT.: 0.04 x 1000/??L (ref 0.00–1.00)
BKR WAM EOSINOPHILS: 1.1 % (ref 0.0–5.0)
BKR WAM HEMATOCRIT (2 DEC): 45 % (ref 35.00–45.00)
BKR WAM HEMOGLOBIN: 14.5 g/dL (ref 11.7–15.5)
BKR WAM IMMATURE GRANULOCYTES: 0.2 % (ref 0.0–1.0)
BKR WAM MCH (PG): 28.3 pg (ref 27.0–33.0)
BKR WAM MCHC: 32.2 g/dL (ref 31.0–36.0)
BKR WAM MCV: 87.7 fL (ref 80.0–100.0)
BKR WAM MONOCYTE ABSOLUTE COUNT.: 0.5 x 1000/??L (ref 0.00–1.00)
BKR WAM MONOCYTES: 5.9 % (ref 4.0–12.0)
BKR WAM NEUTROPHILS: 63.8 % (ref 39.0–72.0)
BKR WAM NUCLEATED RED BLOOD CELLS: 0 % (ref 0.0–1.0)
BKR WAM PLATELETS: 426 x1000/??L — ABNORMAL HIGH (ref 150–420)
BKR WAM RDW-CV: 15.6 % — ABNORMAL HIGH (ref 11.0–15.0)
BKR WAM RED BLOOD CELL COUNT.: 5.13 M/??L (ref 4.00–6.00)
BKR WAM WHITE BLOOD CELL COUNT: 8.4 x1000/??L (ref 4.0–11.0)

## 2021-04-19 LAB — FOLATE: BKR FOLATE: 14.4 ng/mL

## 2021-04-19 LAB — VITAMIN B12: BKR VITAMIN B12: 447 pg/mL (ref 232–1245)

## 2021-04-19 LAB — HEPATITIS C AB WITH REFLEX TO HCV PCR
BKR HEPATITIS C ANTIBODY INITIAL RESULT: 0.02 {s_co_ratio}
BKR HEPATITIS C ANTIBODY INTERPRETATION: NONREACTIVE
BKR PROTEIN, UA: 0.02 S/CO

## 2021-04-19 LAB — HIV-1/HIV-2 ANTIBODY/ANTIGEN SCREEN W/REFLEX     (BH GH LMW YH): BKR HIV 1/2 ANTIBODY SCREEN (BH): NONREACTIVE

## 2021-04-19 LAB — VITAMIN D, 25-HYDROXY: BKR VITAMIN D 25-HYDROXY: 37.3 ng/mL (ref 30.0–100.0)

## 2021-04-20 LAB — LIPID PANEL
BKR CHOLESTEROL/HDL RATIO: 4.9 (ref 0.0–5.0)
BKR CHOLESTEROL: 230 mg/dL — ABNORMAL HIGH
BKR HDL CHOLESTEROL: 47 mg/dL (ref >=40)
BKR LDL CHOLESTEROL SAMPSON CALCULATED: 163 mg/dL — ABNORMAL HIGH (ref 232–1245)
BKR TRIGLYCERIDES: 109 mg/dL

## 2021-04-20 LAB — IRON AND TIBC
BKR IRON SATURATION: 16 % (ref 15–50)
BKR IRON: 42 ug/dL (ref 37–145)
BKR TOTAL IRON BINDING CAPACITY: 260 ug/dL (ref 250–450)

## 2021-04-20 MED ORDER — AZATHIOPRINE 50 MG TABLET
50 | ORAL_TABLET | Freq: Every day | ORAL | 2 refills | 90.00000 days | Status: AC
Start: 2021-04-20 — End: 2021-05-04

## 2021-04-20 MED ORDER — PREDNISONE 10 MG TABLET
10 | Freq: Every day | ORAL | 1.00 refills | 12.00000 days | Status: AC
Start: 2021-04-20 — End: 2021-05-19

## 2021-04-20 MED ORDER — PREDNISONE 5 MG TABLET
5 | ORAL_TABLET | Freq: Every day | ORAL | 1 refills | 12.00000 days | Status: AC
Start: 2021-04-20 — End: 2021-05-04

## 2021-05-03 LAB — MYOSITIS SPECIFIC 11 AB PANEL     (BH GH LMW Q YH)
ANTI-EJ AB: NEGATIVE
ANTI-JO-1 AB: <20 Units (ref <20)
ANTI-KU AB: NEGATIVE mg/dL (ref 1.7–2.4)
ANTI-MDA-5 AB: <20 Units (ref <20)
ANTI-MI-2-AB: NEGATIVE
ANTI-NXP-2 (P140) AB: <20 Units (ref <20)
ANTI-OJ AB: NEGATIVE
ANTI-PL-12 AB: NEGATIVE
ANTI-PL-7 AB: NEGATIVE
ANTI-PM/SCL-100 AB: <20 Units (ref <20)
ANTI-SRP AB: NEGATIVE
ANTI-SS-A 52KD AB, IGG: 185 Units — ABNORMAL HIGH (ref <20)
ANTI-TIF-1GAMMA AB: <20 Units (ref <20)
ANTI-U1 RNP AB: <20 U (ref <20)
ANTI-U2 RNP AB: NEGATIVE
ANTI-U3 RNP (FIBRILLARIN): NEGATIVE mmol/L — ABNORMAL LOW (ref 136–144)
BKR WAM WHITE BLOOD CELL COUNT: <20 Units (ref <20)

## 2021-05-04 MED ORDER — MYCOPHENOLATE MOFETIL 250 MG CAPSULE
250 | ORAL_CAPSULE | Freq: Two times a day (BID) | ORAL | 3 refills | 30.00000 days | Status: AC
Start: 2021-05-04 — End: 2021-05-19

## 2021-05-04 MED ORDER — OMEPRAZOLE 40 MG CAPSULE,DELAYED RELEASE
40 | ORAL_CAPSULE | Freq: Every day | ORAL | 4 refills | 90.00000 days | Status: AC
Start: 2021-05-04 — End: ?

## 2021-05-04 MED ORDER — PREDNISONE 10 MG TABLET
10 | ORAL_TABLET | Freq: Every day | ORAL | 2 refills | 12.00000 days | Status: AC
Start: 2021-05-04 — End: ?

## 2021-05-10 ENCOUNTER — Encounter: Admit: 2021-05-10 | Payer: BLUE CROSS/BLUE SHIELD

## 2021-05-17 ENCOUNTER — Encounter: Admit: 2021-05-17 | Payer: PRIVATE HEALTH INSURANCE

## 2021-05-18 ENCOUNTER — Encounter: Admit: 2021-05-18 | Payer: PRIVATE HEALTH INSURANCE

## 2021-05-18 MED ORDER — MYCOPHENOLATE MOFETIL 250 MG CAPSULE
250 | ORAL_CAPSULE | Freq: Every day | ORAL | 3 refills | 30.00000 days | Status: AC
Start: 2021-05-18 — End: 2021-06-27

## 2021-05-18 MED ORDER — PREDNISONE 5 MG TABLET
5 | ORAL_TABLET | Freq: Every day | ORAL | 3 refills | 12.00000 days | Status: AC
Start: 2021-05-18 — End: 2021-05-19

## 2021-05-18 MED ORDER — PREDNISONE 5 MG TABLET
5 | ORAL_TABLET | Freq: Every day | ORAL | 3 refills | 12.00000 days | Status: AC
Start: 2021-05-18 — End: 2021-06-13

## 2021-06-13 ENCOUNTER — Encounter: Admit: 2021-06-13 | Payer: PRIVATE HEALTH INSURANCE

## 2021-06-13 MED ORDER — PREDNISONE 5 MG TABLET
5 | ORAL_TABLET | ORAL | 2 refills | 12.00000 days | Status: AC
Start: 2021-06-13 — End: 2021-06-27

## 2021-06-19 ENCOUNTER — Encounter: Admit: 2021-06-19 | Payer: PRIVATE HEALTH INSURANCE | Attending: Internal Medicine

## 2021-06-20 MED ORDER — ERGOCALCIFEROL (VITAMIN D2) 1,250 MCG (50,000 UNIT) CAPSULE
1250 | ORAL_CAPSULE | ORAL | 3 refills | 84.00000 days | Status: AC
Start: 2021-06-20 — End: ?

## 2021-06-21 ENCOUNTER — Ambulatory Visit: Admit: 2021-06-21 | Payer: BLUE CROSS/BLUE SHIELD | Attending: Neurology

## 2021-06-21 ENCOUNTER — Encounter: Admit: 2021-06-21 | Payer: PRIVATE HEALTH INSURANCE | Attending: Neurology

## 2021-06-21 ENCOUNTER — Encounter: Admit: 2021-06-21 | Payer: BLUE CROSS/BLUE SHIELD

## 2021-06-21 ENCOUNTER — Inpatient Hospital Stay: Admit: 2021-06-21 | Discharge: 2021-06-21 | Payer: MEDICAID

## 2021-06-21 DIAGNOSIS — R413 Other amnesia: Secondary | ICD-10-CM

## 2021-06-21 DIAGNOSIS — R200 Anesthesia of skin: Secondary | ICD-10-CM

## 2021-06-21 DIAGNOSIS — R519 Increased frequency of headaches: Secondary | ICD-10-CM

## 2021-06-21 DIAGNOSIS — M2550 Pain in unspecified joint: Secondary | ICD-10-CM

## 2021-06-21 DIAGNOSIS — R202 Paresthesia of skin: Secondary | ICD-10-CM

## 2021-06-21 LAB — CBC WITH AUTO DIFFERENTIAL
BKR WAM ABSOLUTE IMMATURE GRANULOCYTES.: 0.03 x 1000/??L (ref 0.00–0.30)
BKR WAM ABSOLUTE LYMPHOCYTE COUNT.: 2.38 x 1000/??L (ref 0.60–3.70)
BKR WAM ABSOLUTE NRBC (2 DEC): 0 x 1000/??L (ref 0.00–1.00)
BKR WAM ANALYZER ANC: 5.27 x 1000/??L (ref 2.00–7.60)
BKR WAM BASOPHIL ABSOLUTE COUNT.: 0.08 x 1000/??L (ref 0.00–1.00)
BKR WAM BASOPHILS: 0.9 % (ref 0.0–1.4)
BKR WAM EOSINOPHIL ABSOLUTE COUNT.: 0.44 x 1000/??L (ref 0.00–1.00)
BKR WAM EOSINOPHILS: 5 % (ref 0.0–5.0)
BKR WAM HEMATOCRIT (2 DEC): 47.2 % — ABNORMAL HIGH (ref 35.00–45.00)
BKR WAM HEMOGLOBIN: 15.1 g/dL (ref 11.7–15.5)
BKR WAM IMMATURE GRANULOCYTES: 0.3 % (ref 0.0–1.0)
BKR WAM LYMPHOCYTES: 27.1 % (ref 17.0–50.0)
BKR WAM MCH (PG): 28.5 pg (ref 27.0–33.0)
BKR WAM MCHC: 32 g/dL (ref 31.0–36.0)
BKR WAM MCV: 89.1 fL (ref 80.0–100.0)
BKR WAM MONOCYTE ABSOLUTE COUNT.: 0.59 x 1000/??L (ref 0.00–1.00)
BKR WAM MONOCYTES: 6.7 % (ref 4.0–12.0)
BKR WAM MPV: 10.1 fL (ref 8.0–12.0)
BKR WAM NEUTROPHILS: 60 % (ref 39.0–72.0)
BKR WAM NUCLEATED RED BLOOD CELLS: 0 % (ref 0.0–1.0)
BKR WAM PLATELETS: 414 x1000/??L (ref 150–420)
BKR WAM RDW-CV: 14.3 % (ref 11.0–15.0)
BKR WAM RED BLOOD CELL COUNT.: 5.3 M/??L (ref 4.00–6.00)
BKR WAM WHITE BLOOD CELL COUNT: 8.8 x1000/??L (ref 4.0–11.0)

## 2021-06-21 LAB — C-REACTIVE PROTEIN     (CRP): BKR C-REACTIVE PROTEIN, HIGH SENSITIVITY: 11.8 mg/L — ABNORMAL HIGH

## 2021-06-21 LAB — AST: BKR ASPARTATE AMINOTRANSFERASE (AST): 23 U/L (ref 10–35)

## 2021-06-21 LAB — BUN/CREATININE/EGFR     (BH GH LMW Q YH)
BKR BLOOD UREA NITROGEN: 11 mg/dL (ref 6–20)
BKR CREATININE: 0.69 mg/dL (ref 0.40–1.30)
BKR EGFR, CREATININE (CKD-EPI 2021): >60 mL/min/{1.73_m2} (ref >=60)

## 2021-06-21 LAB — SEDIMENTATION RATE (ESR): BKR SEDIMENTATION RATE, ERYTHROCYTE: 34 mm/hr — ABNORMAL HIGH (ref 0–20)

## 2021-06-21 LAB — ALBUMIN: BKR ALBUMIN: 4.2 g/dL (ref 3.6–4.9)

## 2021-06-21 LAB — ALKALINE PHOSPHATASE: BKR ALKALINE PHOSPHATASE: 77 U/L (ref 9–122)
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
## 2021-06-21 NOTE — Patient Instructions
Thank you for your visit. Your plan is as follows:  MRI brain with/without IV contrast EMG/Nerve conduction study  Neuropathy blood work (fasting) Neuro-psych testing referral Increase hydration with the goal of 64 ounces of water daily Maintain healthy lifestyle through diet, exercise, and good sleep hygiene.  For all other issues not discussed, please followup with primary care provider. Return to clinic in 2 months' time.

## 2021-06-22 ENCOUNTER — Encounter: Admit: 2021-06-22 | Payer: PRIVATE HEALTH INSURANCE

## 2021-06-22 ENCOUNTER — Emergency Department: Admit: 2021-06-22 | Payer: BLUE CROSS/BLUE SHIELD

## 2021-06-22 ENCOUNTER — Emergency Department: Admit: 2021-06-22 | Payer: MEDICAID

## 2021-06-22 ENCOUNTER — Inpatient Hospital Stay: Admit: 2021-06-22 | Discharge: 2021-06-22 | Payer: BLUE CROSS/BLUE SHIELD

## 2021-06-22 ENCOUNTER — Encounter: Admit: 2021-06-22 | Payer: BLUE CROSS/BLUE SHIELD

## 2021-06-22 DIAGNOSIS — R0789 Other chest pain: Secondary | ICD-10-CM

## 2021-06-22 DIAGNOSIS — R059 Cough, unspecified: Secondary | ICD-10-CM

## 2021-06-22 DIAGNOSIS — R0602 Shortness of breath: Secondary | ICD-10-CM

## 2021-06-22 LAB — CBC WITH AUTO DIFFERENTIAL
BKR WAM ABSOLUTE IMMATURE GRANULOCYTES.: 0.03 x 1000/ÂµL (ref 0.00–0.30)
BKR WAM ABSOLUTE LYMPHOCYTE COUNT.: 2.22 x 1000/ÂµL (ref 0.60–3.70)
BKR WAM ABSOLUTE NRBC (2 DEC): 0 x 1000/ÂµL (ref 0.00–1.00)
BKR WAM ANALYZER ANC: 5.75 x 1000/ÂµL (ref 2.00–7.60)
BKR WAM BASOPHIL ABSOLUTE COUNT.: 0.07 x 1000/ÂµL (ref 0.00–1.00)
BKR WAM BASOPHILS: 0.8 % (ref 0.0–1.4)
BKR WAM EOSINOPHIL ABSOLUTE COUNT.: 0.42 x 1000/ÂµL (ref 0.00–1.00)
BKR WAM EOSINOPHILS: 4.6 % (ref 0.0–5.0)
BKR WAM HEMATOCRIT (2 DEC): 39.5 % (ref 35.00–45.00)
BKR WAM HEMOGLOBIN: 13.2 g/dL (ref 11.7–15.5)
BKR WAM IMMATURE GRANULOCYTES: 0.3 % (ref 0.0–1.0)
BKR WAM LYMPHOCYTES: 24.2 % (ref 17.0–50.0)
BKR WAM MCH (PG): 29.1 pg (ref 27.0–33.0)
BKR WAM MCHC: 33.4 g/dL (ref 31.0–36.0)
BKR WAM MCV: 87.2 fL (ref 80.0–100.0)
BKR WAM MONOCYTE ABSOLUTE COUNT.: 0.67 x 1000/ÂµL (ref 0.00–1.00)
BKR WAM MONOCYTES: 7.3 % (ref 4.0–12.0)
BKR WAM MPV: 9.7 fL (ref 8.0–12.0)
BKR WAM NEUTROPHILS: 62.8 % (ref 39.0–72.0)
BKR WAM NUCLEATED RED BLOOD CELLS: 0 % (ref 0.0–1.0)
BKR WAM PLATELETS: 359 x1000/ÂµL (ref 150–420)
BKR WAM RDW-CV: 14.3 % (ref 11.0–15.0)
BKR WAM RED BLOOD CELL COUNT.: 4.53 M/ÂµL (ref 4.00–6.00)
BKR WAM WHITE BLOOD CELL COUNT: 9.2 x1000/ÂµL (ref 4.0–11.0)

## 2021-06-22 LAB — INFLUENZA A+B/RSV BY RT-PCR
BKR INFLUENZA A: NEGATIVE
BKR INFLUENZA B: NEGATIVE
BKR RESPIRATORY SYNCYTIAL VIRUS: NEGATIVE

## 2021-06-22 LAB — COMPREHENSIVE METABOLIC PANEL
BKR A/G RATIO: 1.3 (ref 1.0–2.2)
BKR ALANINE AMINOTRANSFERASE (ALT): 28 U/L (ref 10–35)
BKR ALBUMIN: 4.2 g/dL (ref 3.6–4.9)
BKR ALKALINE PHOSPHATASE: 74 U/L (ref 9–122)
BKR ANION GAP: 10 (ref 7–17)
BKR BILIRUBIN TOTAL: 0.4 mg/dL (ref <=1.2)
BKR BLOOD UREA NITROGEN: 12 mg/dL (ref 6–20)
BKR BUN / CREAT RATIO: 20 (ref 8.0–23.0)
BKR CALCIUM: 9.1 mg/dL (ref 8.8–10.2)
BKR CHLORIDE: 98 mmol/L (ref 98–107)
BKR CO2: 24 mmol/L (ref 20–30)
BKR CREATININE: 0.6 mg/dL (ref 0.40–1.30)
BKR EGFR, CREATININE (CKD-EPI 2021): >60 mL/min/{1.73_m2} (ref >=60)
BKR GLOBULIN: 3.2 g/dL (ref 2.3–3.5)
BKR GLUCOSE: 94 mg/dL (ref 70–100)
BKR PROTEIN TOTAL: 7.4 g/dL (ref 6.6–8.7)
BKR SODIUM: 132 mmol/L — ABNORMAL LOW (ref 136–144)

## 2021-06-22 LAB — SARS COV-2 (COVID-19) RNA: BKR SARS-COV-2 RNA (COVID-19) (YH): NEGATIVE

## 2021-06-22 MED ORDER — AMOXICILLIN 875 MG-POTASSIUM CLAVULANATE 125 MG TABLET
875-125 mg | Freq: Once | ORAL | Status: CP
Start: 2021-06-22 — End: ?
  Administered 2021-06-23: 03:00:00 875-125 mg via ORAL

## 2021-06-22 MED ORDER — AMOXICILLIN 875 MG-POTASSIUM CLAVULANATE 125 MG TABLET
875-125 mg | ORAL_TABLET | Freq: Two times a day (BID) | ORAL | 1 refills | Status: AC
Start: 2021-06-22 — End: ?

## 2021-06-22 MED ORDER — AZITHROMYCIN 250 MG TABLET
250 mg | ORAL_TABLET | Freq: Every day | ORAL | 1 refills | Status: AC
Start: 2021-06-22 — End: ?

## 2021-06-22 MED ORDER — AZITHROMYCIN 250 MG TABLET
250 mg | Freq: Once | ORAL | Status: CP
Start: 2021-06-22 — End: ?
  Administered 2021-06-23: 03:00:00 250 mg via ORAL

## 2021-06-22 MED ORDER — BENZONATATE 100 MG CAPSULE
100 mg | ORAL_CAPSULE | Freq: Three times a day (TID) | ORAL | 1 refills | Status: AC
Start: 2021-06-22 — End: ?

## 2021-06-22 MED ORDER — IOHEXOL 350 MG IODINE/ML INTRAVENOUS SOLUTION
350 mg iodine/mL | Freq: Once | INTRAVENOUS | Status: CP | PRN
Start: 2021-06-22 — End: ?
  Administered 2021-06-23: 350 mL via INTRAVENOUS

## 2021-06-22 MED ORDER — CODEINE 10 MG-GUAIFENESIN 100 MG/5 ML ORAL LIQUID
10-1005 mg/5 mL | Freq: Once | ORAL | Status: CP
Start: 2021-06-22 — End: ?
  Administered 2021-06-23: 01:00:00 10-100 mL via ORAL

## 2021-06-22 NOTE — ED Triage Note
Chest pain and sobI evaluated this patient as the Physician in triage and completed a medical screening examination.  Patient will need further care.  Initial care orders will be placed as appropriate.Herb Grays, DO5:42 PM

## 2021-06-23 DIAGNOSIS — R911 Solitary pulmonary nodule: Secondary | ICD-10-CM

## 2021-06-23 DIAGNOSIS — Z9981 Dependence on supplemental oxygen: Secondary | ICD-10-CM

## 2021-06-23 DIAGNOSIS — Z20822 Contact with and (suspected) exposure to covid-19: Secondary | ICD-10-CM

## 2021-06-23 DIAGNOSIS — Z79899 Other long term (current) drug therapy: Secondary | ICD-10-CM

## 2021-06-23 DIAGNOSIS — R918 Other nonspecific abnormal finding of lung field: Secondary | ICD-10-CM

## 2021-06-23 LAB — TROPONIN T HIGH SENSITIVITY, 1 HOUR WITH REFLEX (BH GH LMW YH)
BKR TROPONIN T HS 1 HOUR DELTA FROM 0 HOUR: -1 ng/L
BKR TROPONIN T HS 1 HOUR: 11 ng/L

## 2021-06-23 LAB — D-DIMER, QUANTITATIVE: BKR D-DIMER: 0.63 mg{FEU}/L — ABNORMAL HIGH (ref <=0.50)

## 2021-06-23 LAB — TROPONIN T HIGH SENSITIVITY, 0 HOUR BASELINE WITH REFLEX (BH GH LMW YH): BKR TROPONIN T HS 0 HOUR BASELINE: 12 ng/L — ABNORMAL HIGH

## 2021-06-23 NOTE — ED Provider Notes
Chief Complaint Patient presents with ? Chest Pain   Cp with sob     since    2 pm    cough     Medical Decision MakingDx studies + Labs: Orders Placed This Encounter    Symptomatic Admit Covid 4 [place]    SARS CoV-2 (COVID-19) RNA-[facility] ([facility])    Influenza A+B/RSV by RT-PCR ([facility])    CXR    CTA Chest (PE) w IV Contrast    D-dimer, quantitative    Troponin T High Sensitivity, Emergency; 0 hour baseline AND 1 hour with reflex (3 hour)    CBC auto differential    Comprehensive metabolic panel    Troponin T High Sensitivity, 1 Hour With Reflex ([facility])    EKG    EKG    CBC w/diff    CMPDDx:Viral illnessPEACSPNAMDM:  51 year old female presents to the emergency department with a chief complaint of chest pain and tightness.  She admits to chest pain which involves bilateral chest radiating around towards the back.  She describes the pain is sharp.  She admits that the pain additionally involves tightness.  She states that this is new as of 1 day ago.  She admits that she has had a chronic cough for the past 1 year ever since a viral illness.  She is followed by pulmonology for this.  She admits acute on chronic dyspnea.  She denies fever, vomiting, issues with urine or stool.  She denies leg swelling.  Examination reveals a well-appearing patient in no apparent distress with reassuring vital signs.  Auscultation of the heart and lungs is grossly normal.  There are no wheezes.  Patient is moving air in all fields.  Pulse oximetry is normal on room air.  Abdomen soft, nontender.  Legs without edema. Of note, the patient is frequently coughing. Testing was ordered at triage and is notable for mildly positive D-dimer.  Chest x-ray with suggestion of bibasilar pneumonia, however CTA demonstrates nonspecific reticular/ground-glass opacities.  CBC and CMP are without actionable findings.  Troponins are flat, reassuring.Given the patient's acute symptoms, lack of other actionable pathology, and nonspecific imaging findings which may be representative of infection, will treat for PNA, instruct to follow up with Pulm/PCP.Assessment:The primary encounter diagnosis was Lung nodule. A diagnosis of Chest pain, unspecified type was also pertinent to this visit.Plan:ABXDC homeFU with PCP/PulmonologyCourse:CTA Chest (PE) w IV Contrast Final Result     No evidence of pulmonary embolus.    Peripheral reticular and lower zone groundglass opacities are nonspecific, though appear worse in comparison to the 03/12/2021 [place] examination; consider superimposed infectious/inflammatory process and exacerbated interstitial lung disease. Clinical correlation recommended.    Stable 1 cm right middle lobe nodule. Continued follow-up recommended per protocol.    Worsened mediastinal and hilar adenopathy.    Report Initiated by:  Wyn Quaker, MD    Reported and signed by:  Channing Mutters, MD   CXR Final Result  Findings concerning for bibasilar pneumonia.    Reported and signed by:  Bradly Bienenstock, MD   Discharged to home with appropriate return precautions, follow-up instructions. All questions addressed and patient voices understanding of plan of care. RiskOTC drugs.Prescription drug management.  Physical ExamED Triage VitalsBP: 108/74 [06/22/21 1715]Pulse: 78 [06/22/21 1715]Pulse from  O2 sat: n/aResp: (!) 24 [06/22/21 1715]Temp: 97.6 ?F (36.4 ?C) [06/22/21 1715]Temp src: n/aSpO2: 100 % [06/22/21 1716] BP 117/80  - Pulse 83  - Temp 97.6 ?F (36.4 ?C)  -  Resp (!) 21  - Wt 77.2 kg (170 lb 3.1 oz)  - SpO2 97%  - BMI 33.24 kg/m? Physical ExamVitals and nursing note reviewed. Constitutional:     General: She is not in acute distress.   Appearance: She is well-developed. She is not diaphoretic. HENT:    Head: Normocephalic and atraumatic. Eyes: Conjunctiva/sclera: Conjunctivae normal. Cardiovascular:    Rate and Rhythm: Normal rate and regular rhythm.    Pulses: Normal pulses. Pulmonary:    Effort: Pulmonary effort is normal. No respiratory distress.    Breath sounds: Normal breath sounds. No wheezing. Abdominal:    Palpations: Abdomen is soft.    Tenderness: There is no abdominal tenderness. Musculoskeletal:       General: No signs of injury.    Right lower leg: No edema.    Left lower leg: No edema. Skin:   General: Skin is warm and dry. Neurological:    Mental Status: She is alert.    Sensory: No sensory deficit.    Motor: No weakness or abnormal muscle tone. Psychiatric:       Behavior: Behavior normal.  ProceduresAttestation/Critical CareClinical Impressions as of 06/23/21 0126 Lung nodule Chest pain, unspecified type Ground glass opacity present on imaging of lung  ED DispositionDischarge Carolynn Comment, PA02/17/23 0126

## 2021-06-23 NOTE — ED Notes
36:85 PM 51 year old female presents to ED with c/o new onset chest pain and tightness. Pt states she has a hx of lung issues for which she sees a pulmonologist for. Endorses daily SOB which has worsened over the past 2 days. Pt reports mild pain underneath the left breast and mid back pain. Pt also noted to be coughing throughout examination. Rolm Gala, Pa at bedside.8:13 PM Pt medicated per MAR. Repeat blood work drawn and sent. Pending results.8:26 PM Pt moved to CC to await blood work results. Report endorsed to [place], Charity fundraiser. Care deferred.

## 2021-06-23 NOTE — ED Notes
9:37 PM Pt medicated per MAR. Pt provided with discharge paperwork, verbalized understanding and denied any questions. IV removed with catheter intact. Pt ambulated with a steady gait out of the ED.

## 2021-06-26 ENCOUNTER — Encounter: Admit: 2021-06-26 | Payer: PRIVATE HEALTH INSURANCE

## 2021-06-26 ENCOUNTER — Encounter: Admit: 2021-06-26 | Payer: BLUE CROSS/BLUE SHIELD

## 2021-06-27 ENCOUNTER — Encounter: Admit: 2021-06-27 | Payer: BLUE CROSS/BLUE SHIELD

## 2021-06-27 ENCOUNTER — Encounter: Admit: 2021-06-27 | Payer: PRIVATE HEALTH INSURANCE

## 2021-06-27 MED ORDER — MYCOPHENOLATE MOFETIL 500 MG TABLET
500 | ORAL_TABLET | Freq: Two times a day (BID) | ORAL | 4 refills | 30.00000 days | Status: AC
Start: 2021-06-27 — End: 2021-07-12

## 2021-06-27 MED ORDER — PREDNISONE 20 MG TABLET
20 | Freq: Every day | ORAL | 1.00 refills | 12.00000 days | Status: AC
Start: 2021-06-27 — End: 2021-06-27

## 2021-06-27 MED ORDER — PREDNISONE 20 MG TABLET
20 | ORAL_TABLET | Freq: Every day | ORAL | 2 refills | 12.00000 days | Status: AC
Start: 2021-06-27 — End: 2021-08-29

## 2021-07-03 ENCOUNTER — Ambulatory Visit: Admit: 2021-07-03 | Payer: MEDICAID

## 2021-07-06 ENCOUNTER — Ambulatory Visit: Admit: 2021-07-06 | Payer: PRIVATE HEALTH INSURANCE

## 2021-07-10 ENCOUNTER — Encounter: Admit: 2021-07-10 | Payer: BLUE CROSS/BLUE SHIELD

## 2021-07-11 ENCOUNTER — Encounter: Admit: 2021-07-11 | Payer: BLUE CROSS/BLUE SHIELD

## 2021-07-11 ENCOUNTER — Inpatient Hospital Stay: Admit: 2021-07-11 | Discharge: 2021-07-11 | Payer: BLUE CROSS/BLUE SHIELD

## 2021-07-11 ENCOUNTER — Encounter: Admit: 2021-07-11 | Payer: PRIVATE HEALTH INSURANCE

## 2021-07-11 ENCOUNTER — Encounter: Admit: 2021-07-11 | Payer: PRIVATE HEALTH INSURANCE | Attending: Neurology

## 2021-07-11 ENCOUNTER — Ambulatory Visit: Admit: 2021-07-11 | Payer: BLUE CROSS/BLUE SHIELD | Attending: Neurology

## 2021-07-11 DIAGNOSIS — R200 Anesthesia of skin: Secondary | ICD-10-CM

## 2021-07-11 DIAGNOSIS — R202 Paresthesia of skin: Secondary | ICD-10-CM

## 2021-07-11 DIAGNOSIS — O926 Galactorrhea: Secondary | ICD-10-CM

## 2021-07-11 DIAGNOSIS — N911 Secondary amenorrhea: Secondary | ICD-10-CM

## 2021-07-11 DIAGNOSIS — O9089 Other complications of the puerperium, not elsewhere classified: Secondary | ICD-10-CM

## 2021-07-11 LAB — COMPREHENSIVE METABOLIC PANEL
BKR A/G RATIO: 1.3 (ref 1.0–2.2)
BKR ALANINE AMINOTRANSFERASE (ALT): 10 U/L (ref 10–35)
BKR ALBUMIN: 4.1 g/dL (ref 3.6–4.9)
BKR ALKALINE PHOSPHATASE: 58 U/L (ref 9–122)
BKR ANION GAP: 10 (ref 7–17)
BKR ASPARTATE AMINOTRANSFERASE (AST): 21 U/L (ref 10–35)
BKR AST/ALT RATIO: 2.1
BKR BILIRUBIN TOTAL: 0.6 mg/dL (ref <=1.2)
BKR BLOOD UREA NITROGEN: 8 mg/dL (ref 6–20)
BKR BUN / CREAT RATIO: 13.6 (ref 8.0–23.0)
BKR CALCIUM: 9.4 mg/dL (ref 8.8–10.2)
BKR CHLORIDE: 104 mmol/L (ref 98–107)
BKR CO2: 27 mmol/L (ref 20–30)
BKR CREATININE: 0.59 mg/dL (ref 0.40–1.30)
BKR EGFR, CREATININE (CKD-EPI 2021): >60 mL/min/{1.73_m2} (ref >=60)
BKR GLOBULIN: 3.1 g/dL (ref 2.3–3.5)
BKR GLUCOSE: 133 mg/dL — ABNORMAL HIGH (ref 70–100)
BKR POTASSIUM: 4.1 mmol/L (ref 3.50–4.70)
BKR PROTEIN TOTAL: 7.2 g/dL (ref 6.6–8.7)
BKR SODIUM: 141 mmol/L (ref 136–144)

## 2021-07-11 LAB — GTT 2 HOUR: BKR GLUCOSE TOLERANCE TEST 2 HOUR NEW: 131 mg/dL

## 2021-07-11 LAB — VITAMIN B12: BKR VITAMIN B12: 327 pg/mL (ref 232–1245)

## 2021-07-11 LAB — CK     (BH GH L LMW YH): BKR CREATINE KINASE TOTAL: 46 U/L (ref 11–204)

## 2021-07-11 LAB — FOLLICLE STIMULATING HORMONE: BKR FOLLICLE STIMULATING HORMONE: 23.6 m[IU]/mL

## 2021-07-11 LAB — GTT FASTING: BKR GLUCOSE TOLERANCE TEST BASELINE: 133 mg/dL — ABNORMAL HIGH (ref 3.6–4.9)

## 2021-07-12 ENCOUNTER — Inpatient Hospital Stay: Admit: 2021-07-12 | Discharge: 2021-07-12 | Payer: BLUE CROSS/BLUE SHIELD

## 2021-07-12 ENCOUNTER — Telehealth: Admit: 2021-07-12 | Payer: PRIVATE HEALTH INSURANCE

## 2021-07-12 ENCOUNTER — Encounter: Admit: 2021-07-12 | Payer: PRIVATE HEALTH INSURANCE

## 2021-07-12 DIAGNOSIS — Z5181 Encounter for therapeutic drug level monitoring: Secondary | ICD-10-CM

## 2021-07-12 DIAGNOSIS — Z79620 Long term (current) use of immunosuppressive biologic: Secondary | ICD-10-CM

## 2021-07-12 LAB — ANA TITER AND PATTERN W/RFLX   (BH GH): BKR ANA TITER: 1:1280 {titer} — AB (ref 6.6–8.7)

## 2021-07-12 LAB — FREE KAPPA LAMBDA WITH RATIO, SERUM
BKR KAPPA FLC, SERUM: 2.31 mg/dL — ABNORMAL HIGH (ref 0.33–1.94)
BKR KAPPA/LAMBDA FLC RATIO, SERUM: 1.17 (ref 0.26–1.65)
BKR LAMBDA FLC, SERUM: 1.98 mg/dL (ref 0.57–2.63)

## 2021-07-12 LAB — TOTAL PROTEIN AND ALBUMIN,SERUM  (YH)
BKR ALBUMIN, SERUM: 4.1 g/dL (ref 3.6–4.9)
BKR TOTAL PROTEIN, SERUM: 6.7 g/dL (ref 6.0–8.3)

## 2021-07-12 LAB — ANA IFA SCREEN W/REFL TO TITER AND PATTERN, IFA: BKR ANTI-NUCLEAR ANTIBODY (ANA): POSITIVE — AB

## 2021-07-12 LAB — METHYLMALONIC ACID: BKR METHYLMALONIC ACID: 0.14 umol/L (ref 0.00–0.40)

## 2021-07-12 LAB — ANCA SCREEN, WITH REFLEX TO TITER: BKR ANCA SCREEN: NEGATIVE

## 2021-07-12 MED ORDER — AZATHIOPRINE 50 MG TABLET
50 | ORAL_TABLET | Freq: Two times a day (BID) | ORAL | 3 refills | 90.00000 days | Status: AC
Start: 2021-07-12 — End: 2021-08-21

## 2021-07-13 ENCOUNTER — Encounter: Admit: 2021-07-13 | Payer: PRIVATE HEALTH INSURANCE

## 2021-07-13 ENCOUNTER — Encounter: Admit: 2021-07-13 | Payer: BLUE CROSS/BLUE SHIELD | Attending: Neurology

## 2021-07-13 LAB — PROTEIN ELECTROPHORESIS, SERUM    (YH)
BKR ALBUMIN ELP: 4.12 g/dL (ref 3.50–4.70)
BKR ALPHA-1-GLOBULIN: 0.16 g/dL (ref 0.10–0.30)
BKR ALPHA-2-GLOBULIN: 0.82 g/dL (ref 0.60–1.00)
BKR BETA GLOBULIN: 0.73 g/dL (ref 0.70–1.20)
BKR GAMMA GLOBULIN: 0.87 g/dL (ref 0.70–1.50)

## 2021-07-13 LAB — DNA ANTIBODY, DOUBLE-STRANDED: BKR DSDNA ANTIBODY: <12.3 IU/mL (ref <30)

## 2021-07-13 LAB — IMMUNOFIXATION ELECTROPHORESIS, SERUM

## 2021-07-15 ENCOUNTER — Telehealth: Admit: 2021-07-15 | Payer: PRIVATE HEALTH INSURANCE | Attending: Neurology

## 2021-07-15 NOTE — Telephone Encounter
Tried calling patient to discuss results of blood work.  No answer.  She already has a rheumatologist who she saw after my last appointment.  She has a positive ANA which has been stable.Charlies Silvers, MD

## 2021-07-17 LAB — VITAMIN B1, WHOLE BLOOD: VITAMIN B1, WHOLE BLOOD: 121 nmol/L (ref 78–185)

## 2021-07-18 ENCOUNTER — Encounter: Admit: 2021-07-18 | Payer: PRIVATE HEALTH INSURANCE | Attending: Critical Care Medicine

## 2021-07-18 DIAGNOSIS — J849 Interstitial pulmonary disease, unspecified: Secondary | ICD-10-CM

## 2021-07-19 LAB — VITAMIN B3 (NIACIN) (BH GH LMW Q YH)
NICOTINAMIDE: <20 ng/mL
NICOTINIC ACID: <20 ng/mL
VITAMIN B6: <20 ng/mL (ref 2.1–21.7)

## 2021-07-26 ENCOUNTER — Telehealth: Admit: 2021-07-26 | Payer: PRIVATE HEALTH INSURANCE | Attending: Internal Medicine

## 2021-08-01 ENCOUNTER — Encounter: Admit: 2021-08-01 | Payer: PRIVATE HEALTH INSURANCE

## 2021-08-01 LAB — TPMT ACTIVITY     (BH LMW Q): TPMT ACTIVITY: 14 mg/dL (ref 8.8–10.2)

## 2021-08-02 ENCOUNTER — Encounter: Admit: 2021-08-02 | Payer: BLUE CROSS/BLUE SHIELD

## 2021-08-21 ENCOUNTER — Encounter: Admit: 2021-08-21 | Payer: PRIVATE HEALTH INSURANCE

## 2021-08-21 ENCOUNTER — Encounter: Admit: 2021-08-21 | Payer: PRIVATE HEALTH INSURANCE | Attending: Critical Care Medicine

## 2021-08-21 ENCOUNTER — Ambulatory Visit: Admit: 2021-08-21 | Payer: BLUE CROSS/BLUE SHIELD | Attending: Critical Care Medicine

## 2021-08-21 ENCOUNTER — Inpatient Hospital Stay: Admit: 2021-08-21 | Discharge: 2021-08-21 | Payer: BLUE CROSS/BLUE SHIELD

## 2021-08-21 DIAGNOSIS — J849 Interstitial pulmonary disease, unspecified: Secondary | ICD-10-CM

## 2021-08-21 DIAGNOSIS — R0602 Shortness of breath: Secondary | ICD-10-CM

## 2021-08-21 DIAGNOSIS — J84116 Cryptogenic organizing pneumonia: Secondary | ICD-10-CM

## 2021-08-21 DIAGNOSIS — R768 Other specified abnormal immunological findings in serum: Secondary | ICD-10-CM

## 2021-08-21 DIAGNOSIS — K219 Gastro-esophageal reflux disease without esophagitis: Secondary | ICD-10-CM

## 2021-08-21 DIAGNOSIS — D849 Immunodeficiency, unspecified: Secondary | ICD-10-CM

## 2021-08-21 MED ORDER — AZATHIOPRINE 75 MG TABLET
75 | ORAL_TABLET | Freq: Two times a day (BID) | ORAL | 3 refills | 90.00000 days | Status: AC
Start: 2021-08-21 — End: 2021-11-14

## 2021-08-21 MED ORDER — OMEPRAZOLE 20 MG CAPSULE,DELAYED RELEASE
20 mg | Freq: Every day | ORAL | Status: AC
Start: 2021-08-21 — End: 2021-09-21

## 2021-08-21 MED ORDER — SULFAMETHOXAZOLE 800 MG-TRIMETHOPRIM 160 MG TABLET
800-160 mg | ORAL_TABLET | Freq: Two times a day (BID) | ORAL | 1 refills | Status: AC
Start: 2021-08-21 — End: 2021-09-05

## 2021-08-21 NOTE — Patient Instructions
4/28 1030 phone callBarium esophagram [phone] to look for heart burn EchocardiogramPneumococcal Conjugate Vaccine (Prevnar 20) Suspension for InjectionWhat is this medication?PNEUMOCOCCAL VACCINE (NEU mo KOK al vak SEEN) is a vaccine used to prevent pneumococcus bacterial infections. These bacteria can cause serious infections like pneumonia, meningitis, and blood infections. This vaccine will lower your chance of getting pneumonia. If you do get pneumonia, it can make your symptoms milder and your illness shorter. This vaccine will not treat an infection and will not cause infection. This vaccine is recommended for infants and young children, adults with certain medical conditions, and adults 65 years or older.This medicine may be used for other purposes; ask your health care provider or pharmacist if you have questions.COMMON BRAND NAME(S): Hulen Luster, Prevnar 20What should I tell my care team before I take this medication?They need to know if you have any of these conditions:bleeding problemsfeverimmune system problemsan unusual or allergic reaction to pneumococcal vaccine, diphtheria toxoid, other vaccines, latex, other medicines, foods, dyes, or preservativespregnant or trying to get pregnantbreast-feedingHow should I use this medication?This vaccine is for injection into a muscle. It is given by a health care professional.A copy of Vaccine Information Statements will be given before each vaccination. Read this sheet carefully each time. The sheet may change frequently.Talk to your pediatrician regarding the use of this medicine in children. While this drug may be prescribed for children as young as 66 weeks old for selected conditions, precautions do apply.Overdosage: If you think you have taken too much of this medicine contact a poison control center or emergency room at once.NOTE: This medicine is only for you. Do not share this medicine with others.What if I miss a dose?It is important not to miss your dose. Call your doctor or health care professional if you are unable to keep an appointment.What may interact with this medication?medicines for cancer chemotherapymedicines that suppress your immune functionsteroid medicines like prednisone or cortisoneThis list may not describe all possible interactions. Give your health care provider a list of all the medicines, herbs, non-prescription drugs, or dietary supplements you use. Also tell them if you smoke, drink alcohol, or use illegal drugs. Some items may interact with your medicine.What should I watch for while using this medication?Mild fever and pain should go away in 3 days or less. Report any unusual symptoms to your doctor or health care professional.What side effects may I notice from receiving this medication?Side effects that you should report to your doctor or health care professional as soon as possible:allergic reactions like skin rash, itching or hives, swelling of the face, lips, or tonguebreathing problemsconfusedfast or irregular heartbeatfever over 102 degrees Fseizuresunusual bleeding or bruisingunusual muscle weaknessSide effects that usually do not require medical attention (report to your doctor or health care professional if they continue or are bothersome):aches and painsdiarrheafever of 102 degrees F or lessheadacheirritableloss of appetitepain, tender at site where injectedtrouble sleepingThis list may not describe all possible side effects. Call your doctor for medical advice about side effects. You may report side effects to FDA at 1-800-FDA-1088.Where should I keep my medication?This does not apply. This vaccine is given in a clinic, pharmacy, doctor's office, or other health care setting and will not be stored at home.NOTE: This sheet is a summary. It may not cover all possible information. If you have questions about this medicine, talk to your doctor, pharmacist, or health care provider.? 2022 Elsevier/Gold Standard (2014-01-28 10:27:27)

## 2021-08-22 ENCOUNTER — Telehealth: Admit: 2021-08-22 | Payer: PRIVATE HEALTH INSURANCE

## 2021-08-22 ENCOUNTER — Encounter: Admit: 2021-08-22 | Payer: PRIVATE HEALTH INSURANCE

## 2021-08-22 DIAGNOSIS — R5381 Other malaise: Secondary | ICD-10-CM

## 2021-08-22 NOTE — Telephone Encounter
LM on patient's VM to call me back.She needs to increase her Azathioprine to 75 mg bid per Dr. Cheri Rous and Dr. Rubye Beach.Electronically Signed by Jabier Gauss, RN, August 22, 2021

## 2021-08-22 NOTE — Telephone Encounter
-----   Message from Stark Klein, MD sent at 08/21/2021  5:27 PM EDT -----ILD PoolCan you let her know that that she should start taking 75 mg po bid of azathioprine (from 50 bid); Dr. Rubye Beach will send the Rx inThanks----- Message -----From: Dereck Leep, MDSent: 08/21/2021   4:39 PM EDTTo: Stark Klein, MD, Lorrin Mais, MDHello ,Teresa Watson is taking currently 100 mg of Imuran and tolerated wellI changed the prescription to 75 milligrams p.o. b.i.d.Mridu if you can call her would be wonderfulThanks for the updatesMirela ----- Message -----From: Stark Klein, MDSent: 08/21/2021   4:26 PM EDTTo: Dereck Leep, MD, Lorrin Mais, MDHelloSaw Teresa Watson today1) added Bactrim tiw2) what do you think about increasing aza to 150 and leaving her prednisone at 20 for a bit longer3) I am going to see her in June and asked her to get a [place] chest beforehand4) don't think she needs a bronch/biopsy at this point between FHx of autoimmune disease, ANA 1:1280 SSA+ Raynaud's, arthrlagias, even if she does not fit into a categoryI can call her about 2 if that would be helpful and send in an Rx

## 2021-08-24 ENCOUNTER — Encounter: Admit: 2021-08-24 | Payer: BLUE CROSS/BLUE SHIELD | Attending: Diagnostic Radiology

## 2021-08-25 ENCOUNTER — Encounter: Admit: 2021-08-25 | Payer: PRIVATE HEALTH INSURANCE | Attending: Critical Care Medicine

## 2021-08-25 DIAGNOSIS — R5381 Other malaise: Secondary | ICD-10-CM

## 2021-08-28 ENCOUNTER — Telehealth: Admit: 2021-08-28 | Payer: PRIVATE HEALTH INSURANCE | Attending: Critical Care Medicine

## 2021-08-28 NOTE — Telephone Encounter
Daughter dropped off papers for doctor Cheri Rous to complete , please fax to number in the forms also forms in Doctor [place]ubinway file for review

## 2021-08-29 ENCOUNTER — Encounter: Admit: 2021-08-29 | Payer: PRIVATE HEALTH INSURANCE

## 2021-08-29 MED ORDER — PREDNISONE 20 MG TABLET
20 | ORAL_TABLET | ORAL | 2 refills | 12.00000 days | Status: AC
Start: 2021-08-29 — End: ?

## 2021-08-29 MED ORDER — OMEPRAZOLE 40 MG CAPSULE,DELAYED RELEASE
40 | ORAL_CAPSULE | ORAL | 3 refills | 90.00000 days | Status: AC
Start: 2021-08-29 — End: ?

## 2021-09-01 ENCOUNTER — Ambulatory Visit: Admit: 2021-09-01 | Payer: BLUE CROSS/BLUE SHIELD | Attending: Critical Care Medicine

## 2021-09-01 DIAGNOSIS — J849 Interstitial pulmonary disease, unspecified: Secondary | ICD-10-CM

## 2021-09-04 ENCOUNTER — Telehealth: Admit: 2021-09-04 | Payer: PRIVATE HEALTH INSURANCE | Attending: Critical Care Medicine

## 2021-09-04 NOTE — Telephone Encounter
Today is her last day of Bactrim. She wants to know if she needs more to continue taking.

## 2021-09-05 ENCOUNTER — Encounter: Admit: 2021-09-05 | Payer: PRIVATE HEALTH INSURANCE

## 2021-09-05 MED ORDER — SULFAMETHOXAZOLE 800 MG-TRIMETHOPRIM 160 MG TABLET
800-160 mg | ORAL_TABLET | ORAL | 4 refills | Status: AC
Start: 2021-09-05 — End: ?

## 2021-09-05 NOTE — Telephone Encounter
Bactrim DS TIW requested.Electronically Signed by Jabier Gauss, RN, Sep 05, 2021

## 2021-09-05 NOTE — Telephone Encounter
Patient was taking bactrim ds BID for the last 2 weeks since it was written bactrim ds bid dispense 28 tabs.Sent new rx bactrim ds tiw for signature.Electronically Signed by Jabier Gauss, RN, Sep 05, 2021

## 2021-09-06 ENCOUNTER — Ambulatory Visit: Admit: 2021-09-06 | Payer: BLUE CROSS/BLUE SHIELD | Attending: Critical Care Medicine

## 2021-09-06 DIAGNOSIS — J849 Interstitial pulmonary disease, unspecified: Secondary | ICD-10-CM

## 2021-09-06 NOTE — Progress Notes
Stark Klein, M.D., M.P.H.Associate Professor of MedicineAssociate Director, Interstitial Lung Disease ProgramOccupational & Environmental MedicineWinchester Chest Clinic, [facility] [address] [place], [place] [phone] (p)Patient Name:  Teresa Watson of Birth:  06-03-1972Date of Service: 4/17/2023EPIC MRN:  [id number] Provider:  Stark Klein, MDDear Dr. Rob Bunting had the pleasure of seeing Teresa Watson, referred by you in consultation for ILD.History of the Present Illness:This patient is a 51 year old female that prior to contracting covid in 11/20 reports being healthy. She was not hospitalized for her 03/2019 COVID infection. She began developing dyspnea in 2021 after moving here from [place], specifically chest tightness when sitting down to eat, with cough and SOB. Her SOB started in 02/2020 and walk-in clinic diagnosed her with walking pneumonia treated with antibiotics to limited efficacy. After additional symptomatic progression in late 2021 she saw a [place] pulmonologist who suggested 06/2020 biopsy. She states her breathing continued to worsen after this point. She had a second COVID infection in 03/2021, testing positive at the time of her ED presentation, treated with molpurinivir and steroids. She states she did not feel that sick this time.  She saw Dr. Littie Deeds end of 03/2021, who thought she may have autoimmune disease and unlikely related to her COVID infection. Marland Kitchen She was referred her to neurology and rheumatology. Dr. Rubye Beach started her on prednisone  who started the patient on Prednisone 10 mg (12/1) then 5 mg. Her respiratory symptoms temporarily improve with steroids but tend to return after some time. Her first dose of Prednisone was in treatment primarily of her severe diffuse arthralgias which have improved significantly. She had a positive Sjogren's antibody. She had Cellcept starting 12/29 for 2 months, which did help her joint pains at 750 mg po bid but at some poitn when she went to 1500 mg po bid she began to vfeel weaker.   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 She remains on Prednisone 20 mg and Imuran 50 mg QD at this point. She expresses concern regarding employment, as she is unable to perform some job tasks such as talking on the phone for extended periods. Answers for HPI/ROS submitted by the patient on 4/14/2023Fatigue: YesFacial swelling: YesDental problems: YesHoarseness: YesSore Throat: YesCongestion: [name] redness: YesCough: YesShortness of breath: YesWheezing: YesNoisy breathing (Stridor): YesChest pain: YesIrregular/fast heartbeat: YesUrinating Often : YesPaleness: YesDizziness: YesHeadacheHomero FellersDolan Amen SymptomsCough yes frequent, since 1 year, but 50% better since 06/2021 Prednisone. Coughs in morning, unable to expectorate. Clear sputum.Shortness of breath on ambulation and when talking since 2021. Was progressing but now improving. Improved with steroids. SpO2 desats to 85% on ambulation w/o O2.Activity level walks 15-20 minutes, more difficult as of late. Oxygen usage 2-4LCONNECTIVE TISSUE DISEASE SYMPTOMS:Dry eye:  noDry mouth: noSkin rash:  noPhotosensitivity:  Some.Skin thickening:  noDrying/cracking of hands/fingers:  Yes, resolved starting medications. Arthralgias:  Yes, resolved since starting medications.Morning stiffness:  noMyalgias:  Yes, resolved since starting medications.Numbness/Tingling: Face. Proximal muscle weakness:  Yes, resolved since starting medications.Raynaud's:  Yes, improved since starting medications. Reflux/regurgitation:  Yes. Taking Omeprazole which helps. Dysphagia:  noOther:  Some forgetfulness. Hoarse voice (going to see ENT).  MEDICATION HISTORY RELATED TO OZH:YQMVHQIONG:   noNitrofurantoin: noMethotrexate:  No?Other:The remainder of the review of systems is negative in detail.PAST MEDICAL HISTORYPAST SURGICAL HISTORYALLERGIESFAMILY HISTORYSOCIAL HISTORY  Past Medical History:Not sure if screened for TB previously. Past Surgical History:Cholecystectomy.Breast reductionC-section x2.Bronch.HEALTH MAINTENANCE:Flu shot:  noCOVID: x2, last in 09/2019. No symptoms s/p vaccination. Prevnar:Pneumococcal: noShingrix:DXA:  Immunization History  Administered Date(s) Administered ? COVID-19 Vaccine - PFIZER 08/24/2019, 09/26/2019 ? Influenza, seasonal trivalent, adjuvanted, preservative free 03/26/2013 ? Influenza, unspecified formulation 06/17/2015, 05/17/2016 Medications:Outpatient Medications Marked as Taking for the 08/21/21 encounter (Office Visit) with Stark Klein, MD Medication Sig Dispense Refill ? azaTHIOprine (IMURAN) 50 mg tablet Take 1 tablet (50 mg total) by mouth 2 (two) times daily. 60 tablet 2 ? ergocalciferol (ERGOCALCIFEROL) 1,250 mcg (50,000 unit) capsule Take 1 capsule (50,000 Units total) by mouth once a week. 4 capsule 2 ? home oxygen - outpatient 2 L/min by Nasal route continuous. Including Portability and Concentrator   ? levonorgestreL (MIRENA) IUD 1 each by Intrauterine route once.   ? mv,calcium,min/iron/folic/vitK (MULTI FOR HER ORAL)    ? omega-3s-dha-epa-fish oil (FISH OIL) 120 mg-180 mg- 60 mg-1,200 mg CpDR    ? omeprazole (PRILOSEC) 20 mg capsule Take 1 capsule (20 mg total) by mouth daily.   ? predniSONE (DELTASONE) 20 mg tablet Take 2 tablets (40 mg total) by mouth daily. Take with food. 40 MG X7 DAYS ,20 MG X 30 DAYS 90 tablet 1 ? vitamin E 400 UNIT capsule    Ms. Macconnell has never taken amiodarone or nitrofurantoin.Allergies:No Known AllergiesFamily History:Family History Problem Relation Age of Onset ? Hypertension Mother  ? Diabetes Mother  ? Glaucoma Mother  ? Prostate cancer Father  ? Peripheral vascular disease Father  ? Lupus Sister  ? Thyroid disease Sister  ? No Known Problems Brother  ILD FHxLung disease: noLiver disease: noConnective tissue disease: Early grey hair:  Father? Anemias/leukemias:Lung cancer: noRheumatologic History: Sister with lupus, other sister with Sjogren's. Daughter with thyroid. Maternal grandmother with RA.Other: Social History:Social History Socioeconomic History ? Marital status: Married Tobacco Use ? Smoking status: Never ? Smokeless tobacco: Never Vaping Use ? Vaping Use: Never used Substance and Sexual Activity ? Alcohol use: Never ? Drug use: Never Smoking: none.Alcohol: none.Drugs: NoneMarried: yesChildren: x3, heatlhyTravel History: [place].Prior places of residence (international):Other:  OCCUPATIONAL AND ENVIROMENTAL HISTORY OCCUPATIONAL AND ENVIRONMENTAL HISTORY:Job Title: Remote work from home. Employer: [employer]. Asbestos:  noSilica:  noMetal dust:  noMetalworking fluids:  noWood dust:  NoAgricultural/farming:  noPets: noBird exposure: noFeather products: noMold exposure/water damage: noHot tub: noHumidifier: noHeating/cooling system: AC cleaned x2 per year.Other hobbies or activities of concern: noAny dusts gases/vapors or fumes in workplace not specified above:Any dust gases/vapors or fumes at home (hobbies etc) not specified above:PHYSICAL EXAMINATION Physical Examination:Vital signs:  BP 121/81 (Site: r a, Position: Sitting, Cuff Size: Large)  - Pulse 83  - Temp 97.3 ?F (36.3 ?C) (Temporal)  - Resp 20  - Ht 4' 11.72 (1.517 m)  - Wt 80 kg  - SpO2 100% Comment: 2 liters - BMI 34.77 kg/m?  on   Weight 176.37 lbsGen NADHEENT PERRLA, EOMINeck: no LADChest: clear to auscultatin, non wheezes, rhonchi or ralesCardiovascular:  Normal S1, S2, - no murmurs, rubs or gallopsAbdomen: soft nontender nondistended NABSExtremities: no cyanosis, clubbing or edeaNeurological: nonfocalPulmonary Studies Pulmonary Function Testing:Date FVC FEV1 FEV1/FVC TLC DLCO (hgb) 08/21/2021 1.12 (38%) 1.00 (42%) 89% 2.00 (45%) 1.65 (9%) 06/20/2020 PFT: TLC, FVC 64%; FEV1 71%; diffusion 41%- corrects to normal. 08/21/2021 6MWT:Chest RadiologyChest CT2/16/2023 CTA Chest PE:No evidence of pulmonary embolus.Peripheral reticular and lower zone groundglass opacities are nonspecific, though appear worse in comparison to the 03/12/2021 [place] examination; consider superimposed infectious/inflammatory process and exacerbated interstitial lung disease. Clinical correlation recommended.Stable 1 cm right middle lobe nodule. Continued follow-up recommended per protocol.Worsened mediastinal and hilar adenopathy.03/12/2021 CTA Chest PE:1.  No evidence of pulmonary embolism.2.         Diffuse bilateral groundglass opacification in a somewhat peripheral predominant distribution, which may represent sequela of prior infection or developing fibrosis although active infection cannot be excluded.3.         1.0 cm consolidation in the right middle lobe, for which follow-up [place] chest is recommended to ensure resolution.4.         Bilateral hilar and mediastinal lymphadenopathy, likely reactive.Chest Xray2/16/2023 XR Chest PA and Lateral:Findings concerning for bibasilar pneumonia.08/10/2020 XR Chest 2 views:Comparison: 1/12/2022No change compared to prior examination. Prominent interstitial markings more pronounced in the lung bases. Findings may represent atypical pneumonitis. 07/01/2020 XR Chest:Bilateral interstitial opacities. Differential includes interstitial edema, reactive airway disease, atypical bacterial or viral infection.VQ SSurgical Biopsy3/05/2020 Surgical Pathology Lung, YNW:GNFA tissue with chronic inflammation and sclerosis with features of organizing pneumonia. Cardiac Studies Cardiac studies:EKGComponent    Latest Ref Rng 11/19/2020 03/12/2021 06/22/2021 Heart Rate    bpm 90  81  82  SEVERITY    severity Normal ECG  Normal ECG  Normal ECG  QRS Interval    ms   90  QT Interval    ms   332  QTC Interval    ms   387  P Axis    deg   31  QRS Axis    deg   18  T Wave Axis    deg   19  P-R Interval    msec   172  Laboratory Studies Lab Results Component Value Date  WBC 9.2 06/22/2021  HGB 13.2 06/22/2021  HCT 39.50 06/22/2021  MCV 87.2 06/22/2021  PLT 359 06/22/2021 Lab Results Component Value Date  NA 141 07/11/2021  K 4.1 07/11/2021  CL 104 07/11/2021  CO2 27 07/11/2021 Lab Results Component Value Date  BUN 8 07/11/2021 Lab Results Component Value Date  CREATININE 0.59 07/11/2021 Lab Results Component Value Date  ALT 10 07/11/2021  AST 21 07/11/2021  ALKPHOS 58 07/11/2021  BILITOT 0.6 07/11/2021 Immunology	 Latest Reference Range & Units 04/06/21 17:03 07/11/21 09:56 Immunofixation Electrophoresis Gel   Normal immunofixation electrophoresis. No evidence of a serum monoclonal component. Kappa Free Light Chains, Serum 0.33 - 1.94 mg/dL  2.13 (H) Lambda Free Light Chains, Serum 0.57 - 2.63 mg/dL  0.86 Free Kappa/Lambda Ratio 0.26 - 1.65   1.17 C3 Complement 90 - 180 mg/dL 578  C4 Complement 10 - 40 mg/dL 33  Rheumatoid Factor <46.9 IU/mL <10.0  Scl-70 Antibody  <1.0  Sjogren's Antibody (SS-A)  >8.0 (H)  ANA <1:80 Negative  Positive ! Positive ! ANA Titer <1:80 Negative  1:1280 ! 1:1280 ! ANA Pattern  Speckled Speckled Sm/RNP Antibody  <1.0  dsDNA Ab <30 IU/mL IU/mL <12.3 <12.3 ANCA Screen Negative  Negative Negative Myeloperoxidase Ab <1.0 AI <1.0  Proteinase-3 Antibody <1.0 AI <1.0  (H): Data is abnormally high!: Data is abnormal Latest Reference Range & Units 04/06/21 17:03 Anti-EJ Ab Negative  Negative Anti-Jo-1 Ab <20 Units <20 Anti-Ku Ab Negative  Negative Anti-MDA-5 Ab  <20 Units <20 Anti-Mi-2-Ab Negative  Negative Anti-NXP-2 (P140) Ab <20 Units <20 Anti-OJ Ab Negative  Negative Anti-PL-12 Ab Negative  Negative Anti-PL-7 Ab Negative  Negative Anti-PM/Scl-100 Ab <20 Units <20 Anti-SRP Ab Negative  Negative Anti-SS-A 52kD Ab, IgG <20 Units 185 (H) Anti-TIF-1gamma Ab <20 Units <20 Anti-U1 RNP Ab <20 Units <20 Anti-U2 RNP Ab Negative  Negative Anti-U3 RNP (Fibrillarin) Negative  Negative Beta-2 Glycoprotein Ab, IgG <7.0 U/mL 1.0 CCP Ab, IgG <  7.0 EliA U/mL 0.8 Centromere B Antibody  <1.0 RNA Polymerase III Antibody, IgG <20 Units <20 TPMT Activity  16 (H): Data is abnormally high\ Latest Reference Range & Units 04/19/21 08:52 HIV 1/2 Antibody Screen Non-Reactive  Non-Reactive Hepatitis C Antibody Non-Reactive  Non-Reactive Hepatitis B core IgM ab Component 06/01/2020   Hepatitis B core IgM ab NON-REACTIVE 1.	COMMENT/ RECOMMENDATIONS Comment:Teresa Watson is a 51 year old woman with no previous medical history, who is referred for an evaluation of interstitial lung disease.  She has had 2 bouts of COVID infection including November 2020 as well as November 2022, which were relatively mild with the 2nd one, for which she was given lopinavir.  After her 1st COVID infection approximately a year later, she presented with dyspnea and went to an Urgent Care.  She ultimately saw a pulmonologist in February 2022 and had a bronchoscopy with transbronchial biopsies and was told that she potentially had organizing pneumonia.  She had moved to [place] in 2021.  She returned given that she has concerns about her health care.  She had a 2nd bout of COVID infection in November of 2022 and ended up seeing Dr. Littie Deeds at the end of the month.  She was started on prednisone 10 mg by Dr. Rubye Beach.  Eventually saw Dr. Rubye Beach in December, was started on CellCept, which she took for approximately a couple of months, which helped some of her joint pains and myalgias that she had at the time.  When she got to 1500 twice a day, she started feeling weaker and ultimately was transitioned over to azathioprine 50 mg twice a day.  She was given also in February.  Her prednisone was increased to 40 mg a day and she is currently on 20 mg a day.  She feels that from a respiratory standpoint, her cough has gotten better, but she still desaturates to the mid 80s and sometimes as low as 83%.  She is noted to have an ANA of 1-12 80 and an SSA of 185.  She does not have dry eyes, dry mouth.  She has reflux.  She is noted to have a Raynaud phenomenon.  Abnormal capillary nail fold bilateral hand synovitis, raising suspicion for connective tissue disease.  She was also seeing Dr. Mike Craze from Neurology for facial numbness and he wonders if this is due to a nerve damage from a root canal and she is to be having an EMG in May. We have several issues:1.	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  There are several other issues.2.	In terms of etiology, she does have some reflux.  I would like to get a barium esophagram.  She is currently on anti-reflux therapy.3.	In terms of treatment, at this point, I would like to talk to Dr. Rubye Beach about increasing her azathioprine to 150 mg a day as well as maintain the prednisone at 20 mg a day for a bit longer.  I do wonder about some concern for renal crisis given that scleroderma like diagnosis has been in the differential, but she has seemed to have tolerated prednisone already for several months and I would like to get a CAT scan in May to see if there is any improvement.4.	I would like to add Bactrim for PJP prophylaxis.  5.	I would like to  do an echocardiogram to rule out pulmonary hypertension.6.	I would like to refer her to pulmonary rehabilitation.7.	We had a long conversation about prognosis and I said I do wonder from looking at her CAT scan features if there is evidence of inflammatory disease that may respond to steroids.  Secondary immunosuppressive agents do take some time and she was only started on a lower dose of prednisone.  I would like to see how she does in a few months.  We can also see if there is any evidence of fibrotic changes and make an assessment about whether or not antifibrotics will be useful.8.	We briefly discussed lung transplant.  I will continue that conversation. 9.	She does need a 1 cm right middle lobe lung nodule to be followed up as well as her adenopathy. 10.	I will discuss with her on the phone next week in April and also see her in June the week before she should get her CAT scan at that time.06/2021 [place] appears mostly inflammatory. She started on higher dose Prednisone after this scan and I feel we should wait an additional 2-3 months to allow for additional potential improvement. sShe is scheduled for EMG for facial numbness.Her pulmonary function test showed profound impairment although she was unable to adhere to standards for an appropriate diffusion.  She does of note require oxygen.Recommendations:Plan/Orders:1. Review at ILD conference2. Barium esophagram. 3. Continue Prednisone 20 mg.4. Continue azathioprine-- Consider increasing dosage w Dr. Rubye Beach.5. Start bactrim tiw-- PJP prophylaxis.6. Echocardiogram.7. Pulmonary rehabilitation referral.  OtherScribed for Stark Klein, MD by Dellia Cloud, medical scribe August 21, 2021  The documentation recorded by the scribe accurately reflects the services I personally performed and the decisions made by me. I reviewed and confirmed all material entered and/or pre-charted by the scribe.

## 2021-09-07 ENCOUNTER — Encounter: Admit: 2021-09-07 | Payer: PRIVATE HEALTH INSURANCE

## 2021-09-08 ENCOUNTER — Telehealth: Admit: 2021-09-08 | Payer: PRIVATE HEALTH INSURANCE | Attending: Critical Care Medicine

## 2021-09-08 NOTE — Telephone Encounter
Spoke to [place] at request of Dr Cheri Rous. She had already spoken to [place] and received information on POCs and will be contacting the numbers that were provided to her.   Talana and her spouse are currently having ongoing discussions about transplant referral. She is considering being referred to [facility] as well and if they decide to proceed will contact clinic Electronically Signed by Fara Boros, RN, Sep 08, 2021

## 2021-09-19 ENCOUNTER — Encounter: Admit: 2021-09-19 | Payer: BLUE CROSS/BLUE SHIELD | Attending: Neurology

## 2021-09-19 ENCOUNTER — Ambulatory Visit: Admit: 2021-09-19 | Payer: BLUE CROSS/BLUE SHIELD | Attending: Rehabilitative and Restorative Service Providers"

## 2021-09-19 ENCOUNTER — Inpatient Hospital Stay: Admit: 2021-09-19 | Discharge: 2021-09-19 | Payer: BLUE CROSS/BLUE SHIELD

## 2021-09-19 DIAGNOSIS — R413 Other amnesia: Secondary | ICD-10-CM

## 2021-09-19 DIAGNOSIS — R202 Paresthesia of skin: Secondary | ICD-10-CM

## 2021-09-19 DIAGNOSIS — R519 Headache, unspecified: Secondary | ICD-10-CM

## 2021-09-19 MED ORDER — GADOTERATE MEGLUMINE 0.5 MMOL/ML (376.9 MG/ML) INTRAVENOUS SOLUTION
0.5 mmol/mL (376.9 mg/mL) | Freq: Once | INTRAVENOUS | Status: CP | PRN
Start: 2021-09-19 — End: ?
  Administered 2021-09-19: 13:00:00 0.5 mL via INTRAVENOUS

## 2021-09-19 NOTE — Progress Notes
Please see EMG report for details. Charlies Silvers, MD

## 2021-09-20 ENCOUNTER — Encounter: Admit: 2021-09-20 | Payer: PRIVATE HEALTH INSURANCE

## 2021-09-20 ENCOUNTER — Ambulatory Visit: Admit: 2021-09-20 | Payer: BLUE CROSS/BLUE SHIELD | Attending: Anesthesiology

## 2021-09-20 ENCOUNTER — Encounter: Admit: 2021-09-20 | Payer: BLUE CROSS/BLUE SHIELD

## 2021-09-20 ENCOUNTER — Inpatient Hospital Stay: Admit: 2021-09-20 | Discharge: 2021-09-20 | Payer: BLUE CROSS/BLUE SHIELD

## 2021-09-20 DIAGNOSIS — K219 Gastro-esophageal reflux disease without esophagitis: Secondary | ICD-10-CM

## 2021-09-20 DIAGNOSIS — R0602 Shortness of breath: Secondary | ICD-10-CM

## 2021-09-20 MED ORDER — BARIUM SULFATE 98 % ORAL POWDER FOR SUSPENSION
98 % | Freq: Once | ORAL | Status: DC | PRN
Start: 2021-09-20 — End: 2021-09-25

## 2021-09-21 ENCOUNTER — Inpatient Hospital Stay: Admit: 2021-09-21 | Discharge: 2021-09-21 | Payer: BLUE CROSS/BLUE SHIELD | Attending: Internal Medicine

## 2021-09-21 ENCOUNTER — Ambulatory Visit: Admit: 2021-09-21 | Payer: BLUE CROSS/BLUE SHIELD

## 2021-09-21 ENCOUNTER — Ambulatory Visit: Admit: 2021-09-21 | Payer: BLUE CROSS/BLUE SHIELD | Attending: Anesthesiology

## 2021-09-21 ENCOUNTER — Encounter: Admit: 2021-09-21 | Payer: BLUE CROSS/BLUE SHIELD | Attending: Neurology

## 2021-09-21 ENCOUNTER — Encounter: Admit: 2021-09-21 | Payer: PRIVATE HEALTH INSURANCE | Attending: Internal Medicine

## 2021-09-21 DIAGNOSIS — J309 Allergic rhinitis, unspecified: Secondary | ICD-10-CM

## 2021-09-21 DIAGNOSIS — E669 Obesity, unspecified: Secondary | ICD-10-CM

## 2021-09-21 DIAGNOSIS — Z6835 Body mass index (BMI) 35.0-35.9, adult: Secondary | ICD-10-CM

## 2021-09-21 DIAGNOSIS — E782 Mixed hyperlipidemia: Secondary | ICD-10-CM

## 2021-09-21 DIAGNOSIS — J8410 Pulmonary fibrosis, unspecified: Secondary | ICD-10-CM

## 2021-09-21 DIAGNOSIS — Z79624 Long term (current) use of inhibitors of nucleotide synthesis: Secondary | ICD-10-CM

## 2021-09-21 DIAGNOSIS — R911 Solitary pulmonary nodule: Secondary | ICD-10-CM

## 2021-09-21 DIAGNOSIS — Z7952 Long term (current) use of systemic steroids: Secondary | ICD-10-CM

## 2021-09-21 DIAGNOSIS — M069 Rheumatoid arthritis, unspecified: Secondary | ICD-10-CM

## 2021-09-21 DIAGNOSIS — Z79899 Other long term (current) drug therapy: Secondary | ICD-10-CM

## 2021-09-21 DIAGNOSIS — I10 Essential (primary) hypertension: Secondary | ICD-10-CM

## 2021-09-21 DIAGNOSIS — K219 Gastro-esophageal reflux disease without esophagitis: Secondary | ICD-10-CM

## 2021-09-21 DIAGNOSIS — R7303 Prediabetes: Secondary | ICD-10-CM

## 2021-09-21 DIAGNOSIS — Z8616 Personal history of COVID-19: Secondary | ICD-10-CM

## 2021-09-21 DIAGNOSIS — J849 Interstitial pulmonary disease, unspecified: Secondary | ICD-10-CM

## 2021-09-21 DIAGNOSIS — J984 Other disorders of lung: Secondary | ICD-10-CM

## 2021-09-21 LAB — BODY FLUID DIFF     (YH)
BKR CWS EOSINOPHILS, FLUID: 3 %
BKR CWS LYMPHS FLUID: 6 %
BKR CWS MONOCYTE/MACROPHAGE: 77 %
BKR CWS NEUTROPHILS, FLUID: 14 %

## 2021-09-21 LAB — ZZZBODY FLUID CELL COUNT     (YH)
BKR CWS RED BLOOD CELLS, FLUID: 1900 {cells}/uL — ABNORMAL HIGH
BKR NUCLEATED CELLS FLUID: 84 {cells}/uL

## 2021-09-21 LAB — BODY FLUID CELL COUNT     (BH GH LMW YH)

## 2021-09-21 MED ORDER — ONDANSETRON HCL (PF) 4 MG/2 ML INJECTION SOLUTION
4 mg/2 mL | INTRAVENOUS | Status: DC | PRN
Start: 2021-09-21 — End: 2021-09-21
  Administered 2021-09-21: 12:00:00 4 mg/2 mL via INTRAVENOUS

## 2021-09-21 MED ORDER — PHENYLEPHRINE 1 MG/10 ML (100 MCG/ML) IN 0.9 % SOD.CHLORIDE IV SYRINGE
1 mg/0 mL (00 mcg/mL) | INTRAVENOUS | Status: DC | PRN
Start: 2021-09-21 — End: 2021-09-21
  Administered 2021-09-21 (×3): 1 mg/0 mL (00 mcg/mL) via INTRAVENOUS

## 2021-09-21 MED ORDER — LIDOCAINE (PF) 10 MG/ML (1 %) INJECTION SOLUTION
10 mg/mL (1 %) | Status: DC | PRN
Start: 2021-09-21 — End: 2021-09-21
  Administered 2021-09-21: 12:00:00 10 mg/mL (1 %)

## 2021-09-21 MED ORDER — FENTANYL (PF) 50 MCG/ML INJECTION SOLUTION
50 mcg/mL | INTRAVENOUS | Status: DC | PRN
Start: 2021-09-21 — End: 2021-09-21
  Administered 2021-09-21: 12:00:00 50 mcg/mL via INTRAVENOUS

## 2021-09-21 MED ORDER — SUGAMMADEX 100 MG/ML INTRAVENOUS SOLUTION
100 mg/mL | INTRAVENOUS | Status: DC | PRN
Start: 2021-09-21 — End: 2021-09-21
  Administered 2021-09-21: 13:00:00 100 mg/mL via INTRAVENOUS

## 2021-09-21 MED ORDER — ROCURONIUM 10 MG/ML INTRAVENOUS SOLUTION
10 mg/mL | INTRAVENOUS | Status: DC | PRN
Start: 2021-09-21 — End: 2021-09-21
  Administered 2021-09-21: 12:00:00 10 mg/mL via INTRAVENOUS

## 2021-09-21 MED ORDER — LIDOCAINE HCL 10 MG/ML (1 %) INJECTION SOLUTION
10 mg/mL (1 %) | Status: CP
Start: 2021-09-21 — End: ?

## 2021-09-21 MED ORDER — FENTANYL (PF) 50 MCG/ML INJECTION SOLUTION
50 mcg/mL | Status: CP
Start: 2021-09-21 — End: ?

## 2021-09-21 MED ORDER — LIDOCAINE (PF) 20 MG/ML (2 %) INTRAVENOUS SOLUTION
20 mg/mL (2 %) | INTRAVENOUS | Status: DC | PRN
Start: 2021-09-21 — End: 2021-09-21
  Administered 2021-09-21: 12:00:00 20 mg/mL (2 %) via INTRAVENOUS

## 2021-09-21 MED ORDER — LIDOCAINE (PF) 10 MG/ML (1 %) INJECTION SOLUTION
10 mg/mL (1 %) | Freq: Once | TOPICAL | Status: DC
Start: 2021-09-21 — End: 2021-09-21

## 2021-09-21 MED ORDER — SUGAMMADEX 100 MG/ML INTRAVENOUS SOLUTION
100 mg/mL | Status: CP
Start: 2021-09-21 — End: ?

## 2021-09-21 MED ORDER — PROPOFOL 10 MG/ML INTRAVENOUS EMULSION
10 mg/mL | INTRAVENOUS | Status: DC | PRN
Start: 2021-09-21 — End: 2021-09-21
  Administered 2021-09-21: 12:00:00 10 mL/h via INTRAVENOUS

## 2021-09-21 MED ORDER — PROPOFOL 10 MG/ML INTRAVENOUS EMULSION
10 mg/mL | INTRAVENOUS | Status: DC | PRN
Start: 2021-09-21 — End: 2021-09-21
  Administered 2021-09-21: 12:00:00 10 mg/mL via INTRAVENOUS

## 2021-09-21 MED ORDER — LACTATED RINGERS INTRAVENOUS SOLUTION
INTRAVENOUS | Status: DC | PRN
Start: 2021-09-21 — End: 2021-09-21
  Administered 2021-09-21: 12:00:00 via INTRAVENOUS

## 2021-09-21 MED ORDER — SODIUM CHLORIDE 0.9 % (FLUSH) INJECTION SYRINGE
0.9 % | INTRAVENOUS | Status: DC | PRN
Start: 2021-09-21 — End: 2021-09-21

## 2021-09-21 MED ORDER — HYDROCORTISONE SODIUM SUCCINATE 100 MG SOLUTION FOR INJECTION
100 mg | INTRAVENOUS | Status: DC | PRN
Start: 2021-09-21 — End: 2021-09-21
  Administered 2021-09-21: 13:00:00 100 mg via INTRAVENOUS

## 2021-09-21 MED ORDER — SODIUM CHLORIDE 0.9 % (FLUSH) INJECTION SYRINGE
0.9 % | Freq: Three times a day (TID) | INTRAVENOUS | Status: DC
Start: 2021-09-21 — End: 2021-09-21

## 2021-09-21 NOTE — Other
Pt transported to PACU via stretcher with CRNA and RN. Pt HD stable, denies SOB/dizziness/CV complaints/pain at this time. Discharge instructions reviewed with pt at bedside, pt verbalizing understanding. All safety maintained. Pt assisted off unit at time of discharge by unit staff via wheelchair for safety.

## 2021-09-21 NOTE — Other
Post Anesthesia Transfer of Care NotePatient: Teresa Laura SantosProcedure(s) Performed: Procedure(s) (LRB):Envisia - BRONCHOSCOPY, RIGID/FLEX, W/WO FLUORO GUID; W/TRANSBRONCHIAL BX, SINGLE LOBE (N/A) Patient location: PACU Last Vitals: Vitals Value Taken Time BP 118/74 09/21/21 0911 Temp 97.4 09/21/21 0911 Pulse 92 09/21/21 0911 Resp 27 09/21/21 0911 SpO2 96 % 09/21/21 0911 Vitals shown include unvalidated device data.Level of consciousness: awake, alert  and orientedTransport Vital Signs:  Stable since the last set of recorded intra-operative vital signsIntra-operative Complications: noneIntra-operative Intake & Output and Antibiotics as per Anesthesia record and discussed with the RN.

## 2021-09-21 NOTE — Discharge Instructions
[facility] 			Thank you very much for allowing me to see you today.  I appreciate the opportunity to participate in your care.You received sedation today for your procedure.  Do not drive, consume alcohol, make legal decisions, operate heavy machinery or perform strenuous activity today.  You may, however, resume your regular diet today.You may also resume your regular medications today.  No changes were made to your medications.  If you were asked to hold Plavix or Coumadin prior to the procedure, you may restart that medication tonight.You may resume your regular activities tomorrow.  You may have a cough that contains blood for a day or two after the procedure.  The blood will typically be mixed with sputum and less than a handful.  You may also have a fever or chills.  If you have a fever, you may take Tylenol or Ibuprofen at home (unless you typically are not allowed to take these medications).  This should resolve in 48 hours.If you had biopsies taken today, results may take up to a week to be finalized. You should contact your referring physician for results.  If you have not received your results, please call the office at [phone].  Quentin Angst will take a message and direct you to the appropriate individual.  For urgent messages after-hours, please call the hospital operator at [phone].For emergencies after the procedure, you should call 911.  This includes experiencing worsening shortness of breath after the procedure.  It has been a pleasure being part of your medical care.Geryl Rankins, MDInterventional PulmonaryDepartment of Pulmonology, Critical Care and Sleep MedicineYale-New Prairieville Family Hospital

## 2021-09-21 NOTE — Other
[place] Interventional PulmonologyBronchoscopy NoteMaria Norva Watson [phone] Date of procedure: 5/18/2023Attending: Geryl Rankins, MDIndication: ILDProcedure(s) Performed:Flexible bronchoscopyBAL - LULTbbx - LLL and LUL (send out for envisia)Cryobiopsies - LLL and LULAfter informed consent was obtained, a time out for patient safety was performed per protocol.  The patient was induced by the anesthesia team.The scope was advanced through the airway.  Aerosolized 1% lidocaine was used to anesthetize the vocal cords, carina and bilateral hila.  The scope was then advanced to the left and right tracheobronchial trees.  The airways were normal to the level of subsegmental bronchi.  BAL was performed in the LUL. Thereafter, fluoroscopically guided transbronchial biopsies were obtained in the LUL and LLL. There was no significant bleeding after transbronchial biopsies therefore we decided to proceed with cryobiopsies.The LMA was removed and a 8.5 mm Storz bronchoscope was used to intubate the trachea. A 17F endobronchial blocker was palced prophylactically in target lobed prior to cryobiopsies. A 1.26mm cryoprobe was used to obtain cryobiopsies in the LLL and LUL. Given significant extra time for planning an execution of cryobiopsies, in addition to severity of patients ILD, a 22 modifier will be added to the billing codes for transbronchial biopsies.No further diagnostic or therapeutic procedures were indicated and the procedure was concluded.  No immediate complications were noted.  Images from the procedure were saved into Provation and are available for review.The procedure was performed by myself with the assistance of [employer], DO.Geryl Rankins, MD Interventional Pulmonary AttendingDivision of Pulmonary, Critical Care and Sleep MedicineOffice:  [phone] contact via the team Interventional Pulmonary dynamic role in [facility]

## 2021-09-21 NOTE — Other
Operative Diagnosis:Pre-op:   * No pre-op diagnosis entered * Patient Coded Diagnosis   Pre-op diagnosis: Lung nodule  Post-op diagnosis: Lung nodule  Patient Diagnosis   None    Post-op diagnosis:   * Lung nodule [Z61.1]Operative Procedure(s) :Procedure(s) (LRB):Envisia - BRONCHOSCOPY, RIGID/FLEX, W/WO FLUORO GUID; W/TRANSBRONCHIAL BX, SINGLE LOBE (N/A)Post-op Procedure & Diagnosis ConfirmationPost-op Diagnosis: Post-op Diagnosis confirmed (no changes)Post-op Procedure: Post-op Procedure confirmed (no changes)

## 2021-09-21 NOTE — Anesthesia Post-Procedure Evaluation
Anesthesia Post-op NotePatient: Teresa Laura SantosProcedure(s):  Procedure(s) (LRB):Envisia - BRONCHOSCOPY, RIGID/FLEX, W/WO FLUORO GUID; W/TRANSBRONCHIAL BX, SINGLE LOBE (N/A) Patient location: PACULast Vitals:  I have noted the vital signs as listed in the nursing notes.Mental status recovered: patient participates in evaluation: YesVital signs reviewed: YesRespiratory function stable:YesAirway is patent: YesCardiovascular function and hydration status stable: YesPain control satisfactory: YesNausea and vomiting control satisfactory:YesThere were no known notable events for this encounter.

## 2021-09-21 NOTE — Other
Pt brought into room 3. Timeout performed. Sedated and monitored per anesthesia. LMA placed per anesthesia. Bronchoscopy US performed with Dr. Gwenevere Abbot. Lidocaine administered per MD. LUL BAL obtained. TBBX Left lung: to be sent out via [employer] per [place] obtained. Rigid performed with cryo. Cryo biopsies left lung obtained. No blood seen in airways at end of exam. Pt tolerated procedure well. LMA removed per anesthesia. Pt transferred to PACU. Report given to PACU RN.

## 2021-09-21 NOTE — H&P
[facility]	 [facility]	Interventional Pulmonology Surgically-Focused History & PhysicalInterventional Attending: Dr. Karilyn Cota Date: 5/18/23Planned Procedure:bronchoscopy Indication: EBUSMedical History: PMHx: She has no past medical history on file.PSHx: She has no past surgical history on file.Allergies: She has No Known Allergies.Soc: She reports no history of alcohol use. She reports that she has never smoked. She has never used smokeless tobacco. She reports no history of drug use.Medications: Prior to Admission:Current Outpatient Medications: ?  azaTHIOprine, Take 2 tablets (150 mg total) by mouth 2 (two) times daily.?  ergocalciferol, Take 1 capsule (50,000 Units total) by mouth once a week.?  home oxygen - outpatient, 2 L/min by Nasal route continuous. Including Portability and Concentrator?  levonorgestreL, 1 each by Intrauterine route once.?  mv,calcium,min/iron/folic/vitK (MULTI FOR HER ORAL), ?  Fish OiL, ?  omeprazole, Take 1 capsule (20 mg total) by mouth daily.?  omeprazole, TAKE 1 CAPSULE (40 MG TOTAL) BY MOUTH DAILY.?  predniSONE, TAKE 2 TABS BY MOUTH DAILY FOR 7 DAYS THEN 1 TAB DAILY FOR 30 DAYS TAKE WITH FOOD?  sulfamethoxazole-trimethoprim, Take 1 tablet by mouth 3 (three) times a week.?  vitamin E, Allergies as of 09/07/2021 ? (No Known Allergies) Objective: Physical Exam:Awake and alertNot tachypneic, no respiratory distressNo wheeze or stridorNormal S1, S2SARS-CoV-2 RNA (COVID-19)  Date/Time Value Ref Range Status 06/22/2021 05:54 PM Negative Negative Final   Comment:   SARS-CoV-2 target nucleic acids not detected.Depending on assay used, testing was performed using one of the following tests. Contact the laboratory if the specific assay used impacts clinical care.Fact Sheet for Healthcare Providers:Cepheid Xpert Xpress SARS-CoV-2:https://www.fda.gov/media/136313/downloadCepheid Xpert Xpress CoV-2 http://monroe.info/ Xpert Xpress SARS-CoV-2/Flu/RSV CreditCardRecommendations.ca Sheet for Patients:Cepheid Xpert Xpress SARS-CoV-2:https://www.fda.gov/media/136312/downloadCepheid Xpert Xpress CoV-2 HeritageCourse.es Xpert Xpress SARS-CoV-2/Flu/RSV CartridgeClearance.com.cy performed using the GeneXpert real-time RT-PCR assay.Test performance has not been evaluated in asymptomatic patients. Test ordering and result interpretation is at the discretion of the ordering provider.Test Performed MW:[id number], [place] [address]DGLOV: [phone]: Areta Haber, MDCLIA#: [id number]  Impression and Plan: Teresa Watson is a 51 y.o. 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 Pt presents for bronchoscopy Will proceed with BAL, tbbx The details of the procedure and possible complications were discussed with the patient/healthcare proxy who freely signed the consent. All her questions were answered.Earnestine Leys, DO Interventional Pulmonary May 18, 2023Please call [phone] with any questions or concerns.

## 2021-09-21 NOTE — Anesthesia Pre-Procedure Evaluation
This is a 51 y.o. female scheduled for [place] - BRONCHOSCOPY, RIGID/FLEX, W/WO FLUORO GUID; W/TRANSBRONCHIAL BX, SINGLE LOBE.Review of Systems/ Medical HistoryPatient summary, nursing notes, EKG/Cardiac Studies , Labs, pre-procedure vitals, height, weight and NPO status reviewed.No previous anesthesia concernsAnesthesia Evaluation: Estimated body mass index is 35.38 kg/m? as calculated from the following:  Height as of 08/21/21: 4' 11.72 (1.517 m).  Weight as of this encounter: 81.4 kg. CC/HPI: Teresa Watson is a 51 y.o. female with ILD, SOB, polyarthralgia, positive ANA and raised ESR her for bronchoscopy?The patient states she is taking Imuran 75 mg 1 tab PO BID. She is also taking prednisone 20 mg 1 tab PO QD. On [facility]L [facility] limited exercise toleranceOCT 2021 Covid +Dec 2021 -[place] chest ?bilateral ground glass opacities with infiltrates and hilar nodes.??Feb 2022 ?Right lower lobe biopsy was done????????????????????cx negative ,pathology COP?Nov 6th 2022 she was again positive for COVID and was given Molnupiravir,?albuterol?and [place] chest was donePMH:ILD (interstitial lung disease) (HC Code) (HC CODE) (HC Code)	?	COVID?	SOB (shortness of breath)?	Hypoxia?	ANA positive?	Vertigo?	Acute midline low back pain without sciatica?	Allergic rhinitis?	Class 2 obesity due to excess calories without serious comorbidity with body mass index (BMI) of 38.0 to 38.9 in adult?	Elevated blood pressure reading in office with diagnosis of hypertension?	Elevated LDL cholesterol level?	Gastroesophageal reflux disease without esophagitis?	Hypertension?	Menorrhagia?	Nonintractable headache?	History of prediabetes?	Mixed hyperlipidemia?	Cryptogenic organizing pneumonia (HC Code) (HC CODE) (HC Code)?	RA (rheumatoid arthritis) (HC Code)03/12/2021 [place] ChestNo evidence of pulmonary embolism.2.?????????Diffuse bilateral groundglass opacification in a somewhat peripheral predominant distribution, which may represent sequela of prior infection or developing fibrosis although active infection cannot be excluded.3.?????????1.0 cm consolidation in the right middle lobe, for which follow-up [place] chest is recommended to ensure resolution.4.?????????Bilateral hilar and mediastinal lymphadenopathy, likely reactive.06/20/2020 PFT: TLC, FVC 64%; FEV1 71%; diffusion 41%- corrects to normal.DLCO: 9%?Cardiovascular:Patient has a history of: hypertension. -Vascular Disease:  Negative   Respiratory:  Patient has shortness of breath.-Airway Infections: Other airway infections noted: pneumonia.HEENT: Negative.Neuromuscular:   Patient has headaches.Gastrointestinal/Genitourinary: -Gastrointestinal Disorders:  Patient has GERD. Her GERD is well controlled.-Nutritional Disorders: Pt is obese per BMI definition-Hematological/Lymphatic: NegativeEndocrine/Metabolic: -Adrenal Disorders: Patient has a history of recent history of steroid use. Behavioral/Social/Psychiatric & Syndromes: NegativePhysical ExamCardiovascular:    Rhythm: regularPulmonary:  decreased breath soundsAirway:  Mallampati: IITM distance: >3 FBNeck ROM: fullMouth Opening: >3cmDental:  Dentition: caps and crownAnesthesia PlanASA 3 The primary anesthesia plan is  general LMA. Std monitors, Anesthesia plan and risks explained to the patient in detail. All questions answered.  Minor risks include but are not limited to sore throat, PONV, risk of injury to lips/teeth/mouth/throat, possible blood transfusion, awareness under anesthesia. Patient expresses understanding and verbalizes agreement with the above mentioned plan.Perioperative Code Status confirmed: It is my understanding that the patient is currently designated as 'Full Code' and will remain so throughout the perioperative period.Anesthesia informed consent obtained. Consent obtained from: patientUse of blood products: consented  The post operative pain plan is per surgeon management.Plan discussed with CRNA and Attending.Anesthesiologist's Pre Op NoteI personally evaluated and examined the patient prior to the intra-operative phase of care on the day of the procedure.Marland Kitchen

## 2021-09-22 ENCOUNTER — Encounter: Admit: 2021-09-22 | Payer: BLUE CROSS/BLUE SHIELD | Attending: Neurology

## 2021-09-22 ENCOUNTER — Inpatient Hospital Stay: Admit: 2021-09-22 | Discharge: 2021-09-22 | Payer: BLUE CROSS/BLUE SHIELD

## 2021-09-22 ENCOUNTER — Encounter: Admit: 2021-09-22 | Payer: PRIVATE HEALTH INSURANCE

## 2021-09-22 ENCOUNTER — Encounter: Admit: 2021-09-22 | Payer: PRIVATE HEALTH INSURANCE | Attending: Neurology

## 2021-09-22 ENCOUNTER — Ambulatory Visit: Admit: 2021-09-22 | Payer: BLUE CROSS/BLUE SHIELD

## 2021-09-22 DIAGNOSIS — R202 Paresthesia of skin: Secondary | ICD-10-CM

## 2021-09-22 DIAGNOSIS — R768 Other specified abnormal immunological findings in serum: Secondary | ICD-10-CM

## 2021-09-22 DIAGNOSIS — M791 Myalgia, unspecified site: Secondary | ICD-10-CM

## 2021-09-22 DIAGNOSIS — R69 Illness, unspecified: Secondary | ICD-10-CM

## 2021-09-22 DIAGNOSIS — R200 Anesthesia of skin: Secondary | ICD-10-CM

## 2021-09-22 DIAGNOSIS — J849 Interstitial pulmonary disease, unspecified: Secondary | ICD-10-CM

## 2021-09-22 LAB — C3 + C4 COMPLEMENT COMPONENT     (BH GH Q)
BKR C3 COMPLEMENT: 135 mg/dL (ref 90–180)
BKR C4 COMPLEMENT: 25 mg/dL (ref 10–40)

## 2021-09-22 LAB — FLOW CYTOMETRTY - BRONCHIOALVEOLAR LAVAGE WITH CD4/CD8 RATIO     (YH)

## 2021-09-22 LAB — BAL PANEL
BKR CD19+: 0.9 %
BKR CD19+KAPPA+: 0.4 %
BKR CD19+LAMBDA+: 0.4 %
BKR CD3+: 87.3 %
BKR CD3+CD4+: 8.8 %
BKR CD3+CD8+: 88.5 %
BKR CD4/CD8 RATIO: 0.1

## 2021-09-22 LAB — SEDIMENTATION RATE (ESR): BKR SEDIMENTATION RATE, ERYTHROCYTE: 51 mm/hr — ABNORMAL HIGH (ref 0–20)

## 2021-09-22 LAB — CK     (BH GH L LMW YH): BKR CREATINE KINASE TOTAL: 34 U/L (ref 11–204)

## 2021-09-22 LAB — C-REACTIVE PROTEIN     (CRP): BKR C-REACTIVE PROTEIN, HIGH SENSITIVITY: 5.3 mg/L — ABNORMAL HIGH

## 2021-09-22 LAB — VIABILITY (LAB ONLY) (YH): BKR VIABILITY: 5.1 %

## 2021-09-22 NOTE — Other
SHE NEEDS AN APPOINTMENT IN July OR [place]

## 2021-09-22 NOTE — Patient Instructions
Thank you for your visit. Your plan is as follows:  Continue with Neuro-psych testing scheduled 02/07/2022, at 8:30 amContinue with PulmonologyContinue with RheumatologyIncrease hydration with the goal of 64 ounces of water daily Maintain healthy lifestyle through diet, exercise, and good sleep hygiene.  For all other issues not discussed, please followup with primary care provider. Return to clinic in 6 months' time.

## 2021-09-22 NOTE — Progress Notes
The following is the transcribed Review of Systems, done by the patient at Intake, and attached to this is the attending physician's documentation.Review of Systems Constitutional: Negative.  HENT: Positive for congestion.  Eyes: Negative.  Respiratory: Positive for shortness of breath and wheezing.  Cardiovascular: Negative.  Gastrointestinal: Negative.  Genitourinary: Negative.  Musculoskeletal: Positive for joint pain and myalgias. Skin: Negative.  Neurological: Positive for dizziness and headaches. Endo/Heme/Allergies: Negative.  Psychiatric/Behavioral: Negative.
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
## 2021-09-23 ENCOUNTER — Inpatient Hospital Stay: Admit: 2021-09-23 | Discharge: 2021-09-23 | Payer: BLUE CROSS/BLUE SHIELD

## 2021-09-23 DIAGNOSIS — J849 Interstitial pulmonary disease, unspecified: Secondary | ICD-10-CM

## 2021-09-23 LAB — LOWER RESP CULTURE, QUANT: BKR LOWER RESPIRATORY CULTURE: NORMAL

## 2021-09-25 ENCOUNTER — Telehealth: Admit: 2021-09-25 | Payer: PRIVATE HEALTH INSURANCE | Attending: Critical Care Medicine

## 2021-09-25 LAB — DNA ANTIBODY, DOUBLE-STRANDED: BKR DSDNA AB: 0.8 IU/mL (ref <10.0)

## 2021-09-25 LAB — ANA IFA SCREEN W/REFL TO TITER AND PATTERN, IFA: BKR ANTI-NUCLEAR ANTIBODY (ANA): POSITIVE — AB

## 2021-09-25 LAB — ANA TITER AND PATTERN W/RFLX   (BH GH): BKR ANA TITER: 1:1280 {titer} — AB

## 2021-09-25 NOTE — Telephone Encounter
Pt is asking if her disability forms were sent.

## 2021-09-26 ENCOUNTER — Ambulatory Visit: Admit: 2021-09-26 | Payer: BLUE CROSS/BLUE SHIELD

## 2021-09-26 ENCOUNTER — Ambulatory Visit: Admit: 2021-09-26 | Payer: BLUE CROSS/BLUE SHIELD | Attending: Rehabilitative and Restorative Service Providers"

## 2021-09-27 ENCOUNTER — Ambulatory Visit: Admit: 2021-09-27 | Payer: BLUE CROSS/BLUE SHIELD

## 2021-09-27 ENCOUNTER — Encounter: Admit: 2021-09-27 | Payer: PRIVATE HEALTH INSURANCE | Attending: Internal Medicine

## 2021-09-27 ENCOUNTER — Telehealth: Admit: 2021-09-27 | Payer: PRIVATE HEALTH INSURANCE | Attending: Critical Care Medicine

## 2021-09-27 LAB — ALDOLASE: ALDOLASE: 5.9 U/L (ref <=8.1)

## 2021-09-27 NOTE — Telephone Encounter
Pt asked if Misty Stanley faxed paper work to insurance co.  Their fax # is [phone].

## 2021-09-28 ENCOUNTER — Ambulatory Visit: Admit: 2021-09-28 | Payer: BLUE CROSS/BLUE SHIELD | Attending: Rehabilitative and Restorative Service Providers"

## 2021-09-28 LAB — SJOGRENS AB, SS-A     (BH GH LMW Q YH): SJOGREN'S ANTIBODY (SS-A): >8 — ABNORMAL HIGH

## 2021-09-28 NOTE — Telephone Encounter
Refaxed the forms again to the number provided for the local office in the message from today

## 2021-09-29 ENCOUNTER — Ambulatory Visit: Admit: 2021-09-29 | Payer: BLUE CROSS/BLUE SHIELD

## 2021-09-30 LAB — PNEUMOCYSTIS JIROVECII QUAL, PCR     (BH GH LMW YH): P.JIROVECII DNA,QL RT PCR: NOT DETECTED

## 2021-10-03 ENCOUNTER — Encounter: Admit: 2021-10-03 | Payer: PRIVATE HEALTH INSURANCE | Attending: Critical Care Medicine

## 2021-10-03 ENCOUNTER — Ambulatory Visit: Admit: 2021-10-03 | Payer: BLUE CROSS/BLUE SHIELD | Attending: Rehabilitative and Restorative Service Providers"

## 2021-10-04 ENCOUNTER — Encounter: Admit: 2021-10-04 | Payer: BLUE CROSS/BLUE SHIELD | Attending: Neurology

## 2021-10-04 ENCOUNTER — Ambulatory Visit: Admit: 2021-10-04 | Payer: BLUE CROSS/BLUE SHIELD

## 2021-10-05 ENCOUNTER — Ambulatory Visit: Admit: 2021-10-05 | Payer: BLUE CROSS/BLUE SHIELD | Attending: Rehabilitative and Restorative Service Providers"

## 2021-10-06 ENCOUNTER — Ambulatory Visit: Admit: 2021-10-06 | Payer: BLUE CROSS/BLUE SHIELD | Attending: Rehabilitative and Restorative Service Providers"

## 2021-10-09 ENCOUNTER — Encounter: Admit: 2021-10-09 | Payer: PRIVATE HEALTH INSURANCE | Attending: Critical Care Medicine

## 2021-10-09 ENCOUNTER — Ambulatory Visit: Admit: 2021-10-09 | Payer: BLUE CROSS/BLUE SHIELD | Attending: Rehabilitative and Restorative Service Providers"

## 2021-10-09 DIAGNOSIS — J849 Interstitial pulmonary disease, unspecified: Secondary | ICD-10-CM

## 2021-10-09 NOTE — Progress Notes
Stark Klein, M.D., M.P.H.Associate Professor of MedicineAssociate Director, Interstitial Lung Disease ProgramOccupational & Environmental MedicineWinchester Chest Clinic, [facility] [address], [address] [place], [place] [phone] (p)Patient Name:  Teresa Watson of Birth:  January 23, 1972Date of Service: 5/3/2023EPIC MRN:  [id number] Provider:  Stark Klein, MDTELEPHONE VISIT: For this visit the clinician and patient were present via telephone (audio only).Patient counseled on available options for visit type; Patient elected telephone visit; Patient consent given for telephone visit: YesPatient Identity was confirmed during this call.  Other individuals actively participating in the telephone encounter and their name/relation to the patient: noneTotal time spent in medical telephone consultation: 15Because this visit was completed over telephone, a hands-on physical exam was not performed.  Patient understands and knows to call back if condition changes._________________________INITIAL VISIT_______________________________________________________Dear Dr. No ref. provider found,I had the pleasure of seeing Teresa Watson, referred by you in consultation for ILD.History of the Present Illness:This patient is a 51 year old female that prior to contracting covid in 11/20 reports being healthy. She was not hospitalized for her 03/2019 COVID infection. She began developing dyspnea in 2021 after moving here from [place], specifically chest tightness when sitting down to eat, with cough and SOB. Her SOB started in 02/2020 and walk-in clinic diagnosed her with walking pneumonia treated with antibiotics to limited efficacy. After additional symptomatic progression in late 2021 she saw a [place] pulmonologist who suggested 06/2020 biopsy. She states her breathing continued to worsen after this point. She had a second COVID infection in 03/2021, testing positive at the time of her ED presentation, treated with molpurinivir and steroids. She states she did not feel that sick this time.  She saw Dr. Littie Deeds end of 03/2021, who thought she may have autoimmune disease and unlikely related to her COVID infection. Marland Kitchen She was referred her to neurology and rheumatology. Dr. Rubye Beach started her on prednisone  who started the patient on Prednisone 10 mg (12/1) then 5 mg. Her respiratory symptoms temporarily improve with steroids but tend to return after some time. Her first dose of Prednisone was in treatment primarily of her severe diffuse arthralgias which have improved significantly. She had a positive Sjogren's antibody. She had Cellcept starting 12/29 for 2 months, which did help her joint pains at 750 mg po bid but at some poitn when she went to 1500 mg po bid she began to vfeel weaker.   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 She remains on Prednisone 20 mg and Imuran 50 mg QD at this point. She expresses concern regarding employment, as she is unable to perform some job tasks such as talking on the phone for extended periods. Answers for HPI/ROS submitted by the patient on 4/14/2023Fatigue: YesFacial swelling: YesDental problems: YesHoarseness: YesSore Throat: YesCongestion: [name] redness: YesCough: YesShortness of breath: YesWheezing: YesNoisy breathing (Stridor): YesChest pain: YesIrregular/fast heartbeat: YesUrinating Often : YesPaleness: YesDizziness: YesHeadacheHomero FellersDolan Amen SymptomsCough yes frequent, since 1 year, but 50% better since 06/2021 Prednisone. Coughs in morning, unable to expectorate. Clear sputum.Shortness of breath on ambulation and when talking since 2021. Was progressing but now improving. Improved with steroids. SpO2 desats to 85% on ambulation w/o O2.Activity level walks 15-20 minutes, more difficult as of late. Oxygen usage 2-4LCONNECTIVE TISSUE DISEASE SYMPTOMS:Dry eye:  noDry mouth: noSkin rash:  noPhotosensitivity:  Some.Skin thickening:  noDrying/cracking of hands/fingers:  Yes, resolved starting medications. Arthralgias:  Yes, resolved since starting medications.Morning stiffness:  noMyalgias:  Yes, resolved since starting medications.Numbness/Tingling: Face. Proximal muscle weakness:  Yes,  resolved since starting medications.Raynaud's:  Yes, improved since starting medications. Reflux/regurgitation:  Yes. Taking Omeprazole which helps. Dysphagia:  noOther:  Some forgetfulness. Hoarse voice (going to see ENT).  MEDICATION HISTORY RELATED TO ZOX:[id number]:   noNitrofurantoin:   noMethotrexate:  No?Other:The remainder of the review of systems is negative in detail.PAST MEDICAL HISTORYPAST SURGICAL HISTORYALLERGIESFAMILY HISTORYSOCIAL HISTORY  Past Medical History:Not sure if screened for TB previously. Past Surgical History:Cholecystectomy.Breast reductionC-section x2.Bronch.HEALTH MAINTENANCE:Flu shot:  noCOVID: x2, last in 09/2019. No symptoms s/p vaccination. Prevnar:Pneumococcal: noShingrix:DXA:  Immunization History Administered Date(s) Administered ? COVID-19 Vaccine - PFIZER 08/24/2019, 09/26/2019 ? Influenza, seasonal trivalent, adjuvanted, preservative free 03/26/2013 ? Influenza, unspecified formulation 06/17/2015, 05/17/2016 ? Pneumococcal conjugate 20 valent (PCV20) 08/21/2021 Medications:Outpatient Medications Marked as Taking for the 09/06/21 encounter (Telemedicine) with Stark Klein, MD Medication Sig Dispense Refill ? azaTHIOprine (AZASAN/IMURAN) 75 mg tablet Take 2 tablets (150 mg total) by mouth 2 (two) times daily. 60 tablet 2 ? ergocalciferol (ERGOCALCIFEROL) 1,250 mcg (50,000 unit) capsule Take 1 capsule (50,000 Units total) by mouth once a week. 4 capsule 2 ? home oxygen - outpatient 2 L/min by Nasal route continuous. Including Portability and Concentrator   ? levonorgestreL (MIRENA) IUD 1 each by Intrauterine route once.   ? mv,calcium,min/iron/folic/vitK (MULTI FOR HER ORAL)    ? omeprazole (PRILOSEC) 40 mg capsule TAKE 1 CAPSULE (40 MG TOTAL) BY MOUTH DAILY. 30 capsule 2 ? predniSONE (DELTASONE) 20 mg tablet TAKE 2 TABS BY MOUTH DAILY FOR 7 DAYS THEN 1 TAB DAILY FOR 30 DAYS TAKE WITH FOOD 90 tablet 1 ? sulfamethoxazole-trimethoprim (BACTRIM DS;CO-TRIMOXAZOLE DS) 800-160 mg per tablet Take 1 tablet by mouth 3 (three) times a week. 36 tablet 3 ? vitamin E 400 UNIT capsule    Teresa Watson has never taken amiodarone or nitrofurantoin.Allergies:No Known AllergiesFamily History:Family History Problem Relation Age of Onset ? Hypertension Mother  ? Diabetes Mother  ? Glaucoma Mother  ? Prostate cancer Father  ? Peripheral vascular disease Father  ? Lupus Sister  ? Thyroid disease Sister  ? No Known Problems Brother  ILD FHxLung disease: noLiver disease: noConnective tissue disease: Early grey hair:  Father? Anemias/leukemias:Lung cancer: noRheumatologic History: Sister with lupus, other sister with Sjogren's. Daughter with thyroid. Maternal grandmother with RA.Other: Social History:Social History Socioeconomic History ? Marital status: Married Tobacco Use ? Smoking status: Never ? Smokeless tobacco: Never Vaping Use ? Vaping Use: Never used Substance and Sexual Activity ? Alcohol use: Never ? Drug use: Never Smoking: none.Alcohol: none.Drugs: NoneMarried: yesChildren: x3, heatlhyTravel History: [place].Prior places of residence (international):Other:  OCCUPATIONAL AND ENVIROMENTAL HISTORY OCCUPATIONAL AND ENVIRONMENTAL HISTORY:Job Title: Remote work from home. Employer: [employer]. Asbestos:  noSilica:  noMetal dust: noMetalworking fluids:  noWood dust:  NoAgricultural/farming:  noPets: noBird exposure: noFeather products: noMold exposure/water damage: noHot tub: noHumidifier: noHeating/cooling system: AC cleaned x2 per year.Other hobbies or activities of concern: noAny dusts gases/vapors or fumes in workplace not specified above:Any dust gases/vapors or fumes at home (hobbies etc) not specified above:PHYSICAL EXAMINATION Physical Examination:telehealthPulmonary Studies Pulmonary Function Testing:Date FVC FEV1 FEV1/FVC TLC DLCO (hgb) 08/21/2021 1.12 (38%) 1.00 (42%) 89% 2.00 (45%) 1.65 (9%) 06/20/2020 PFT: TLC, FVC 64%; FEV1 71%; diffusion 41%- corrects to normal. 08/21/2021 6MWT:Chest RadiologyChest CT2/16/2023 CTA Chest PE:No evidence of pulmonary embolus.Peripheral reticular and lower zone groundglass opacities are nonspecific, though appear worse in comparison to the 03/12/2021 [place] examination; consider superimposed infectious/inflammatory process and exacerbated interstitial lung disease. Clinical correlation recommended.Stable 1 cm right middle lobe nodule. Continued follow-up recommended per protocol.Worsened mediastinal and  hilar adenopathy.03/12/2021 CTA Chest PE:1.         No evidence of pulmonary embolism.2.         Diffuse bilateral groundglass opacification in a somewhat peripheral predominant distribution, which may represent sequela of prior infection or developing fibrosis although active infection cannot be excluded.3.         1.0 cm consolidation in the right middle lobe, for which follow-up [place] chest is recommended to ensure resolution.4.         Bilateral hilar and mediastinal lymphadenopathy, likely reactive.Chest Xray2/16/2023 XR Chest PA and Lateral:Findings concerning for bibasilar pneumonia.08/10/2020 XR Chest 2 views:Comparison: 1/12/2022No change compared to prior examination. Prominent interstitial markings more pronounced in the lung bases. Findings may represent atypical pneumonitis. 07/01/2020 XR Chest:Bilateral interstitial opacities. Differential includes interstitial edema, reactive airway disease, atypical bacterial or viral infection.VQ SSurgical Biopsy3/05/2020 Surgical Pathology Lung, UJW:JXBJ tissue with chronic inflammation and sclerosis with features of organizing pneumonia. Cardiac Studies Cardiac studies:EKGComponent    Latest Ref Rng 11/19/2020 03/12/2021 06/22/2021 Heart Rate    bpm 90  81  82  SEVERITY    severity Normal ECG  Normal ECG  Normal ECG  QRS Interval    ms   90  QT Interval    ms   332  QTC Interval    ms   387  P Axis    deg   31  QRS Axis    deg   18  T Wave Axis    deg   19  P-R Interval    msec   172  Laboratory Studies Lab Results Component Value Date  WBC 9.2 06/22/2021  HGB 13.2 06/22/2021  HCT 39.50 06/22/2021  MCV 87.2 06/22/2021  PLT 359 06/22/2021 Lab Results Component Value Date  NA 141 07/11/2021  K 4.1 07/11/2021  CL 104 07/11/2021  CO2 27 07/11/2021 Lab Results Component Value Date  BUN 8 07/11/2021 Lab Results Component Value Date  CREATININE 0.59 07/11/2021 Lab Results Component Value Date  ALT 10 07/11/2021  AST 21 07/11/2021  ALKPHOS 58 07/11/2021  BILITOT 0.6 07/11/2021 Immunology	 Latest Reference Range & Units 04/06/21 17:03 07/11/21 09:56 Immunofixation Electrophoresis Gel   Normal immunofixation electrophoresis. No evidence of a serum monoclonal component. Kappa Free Light Chains, Serum 0.33 - 1.94 mg/dL  4.78 (H) Lambda Free Light Chains, Serum 0.57 - 2.63 mg/dL  2.95 Free Kappa/Lambda Ratio 0.26 - 1.65   1.17 C3 Complement 90 - 180 mg/dL 621  C4 Complement 10 - 40 mg/dL 33  Rheumatoid Factor <30.8 IU/mL <10.0  Scl-70 Antibody  <1.0  Sjogren's Antibody (SS-A)  >8.0 (H)  ANA <1:80 Negative  Positive ! Positive ! ANA Titer <1:80 Negative  1:1280 ! 1:1280 ! ANA Pattern  Speckled Speckled Sm/RNP Antibody  <1.0  dsDNA Ab <30 IU/mL IU/mL <12.3 <12.3 ANCA Screen Negative  Negative Negative Myeloperoxidase Ab <1.0 AI <1.0  Proteinase-3 Antibody <1.0 AI <1.0  (H): Data is abnormally high!: Data is abnormal Latest Reference Range & Units 04/06/21 17:03 Anti-EJ Ab Negative  Negative Anti-Jo-1 Ab <20 Units <20 Anti-Ku Ab Negative  Negative Anti-MDA-5 Ab  <20 Units <20 Anti-Mi-2-Ab Negative  Negative Anti-NXP-2 (P140) Ab <20 Units <20 Anti-OJ Ab Negative  Negative Anti-PL-12 Ab Negative  Negative Anti-PL-7 Ab Negative  Negative Anti-PM/Scl-100 Ab <20 Units <20 Anti-SRP Ab Negative  Negative Anti-SS-A 52kD Ab, IgG <20 Units 185 (H) Anti-TIF-1gamma Ab <20 Units <20 Anti-U1 RNP Ab <20 Units <20 Anti-U2 RNP Ab Negative  Negative Anti-U3 RNP (Fibrillarin)  Negative  Negative Beta-2 Glycoprotein Ab, IgG <7.0 U/mL 1.0 CCP Ab, IgG <7.0 EliA U/mL 0.8 Centromere B Antibody  <1.0 RNA Polymerase III Antibody, IgG <20 Units <20 TPMT Activity  16 (H): Data is abnormally high\ Latest Reference Range & Units 04/19/21 08:52 HIV 1/2 Antibody Screen Non-Reactive  Non-Reactive Hepatitis C Antibody Non-Reactive  Non-Reactive Hepatitis B core IgM ab Component 06/01/2020   Hepatitis B core IgM ab NON-REACTIVE 1.	COMMENT/ RECOMMENDATIONS Comment:Teresa Watson is a 51 year old woman with no previous medical history, who is referred for an evaluation of interstitial lung disease.  She has had 2 bouts of COVID infection including November 2020 as well as November 2022, which were relatively mild with the 2nd one, for which she was given lopinavir.  After her 1st COVID infection approximately a year later, she presented with dyspnea and went to an Urgent Care.  She ultimately saw a pulmonologist in February 2022 and had a bronchoscopy with transbronchial biopsies and was told that she potentially had organizing pneumonia.  She had moved to [place] in 2021.  She returned given that she has concerns about her health care.  She had a 2nd bout of COVID infection in November of 2022 and ended up seeing Dr. Littie Deeds at the end of the month.  She was started on prednisone 10 mg by Dr. Rubye Beach.  Eventually saw Dr. Rubye Beach in December, was started on CellCept, which she took for approximately a couple of months, which helped some of her joint pains and myalgias that she had at the time.  When she got to 1500 twice a day, she started feeling weaker and ultimately was transitioned over to azathioprine 50 mg twice a day.  She was given also in February.  Her prednisone was increased to 40 mg a day and she is currently on 20 mg a day.  She feels that from a respiratory standpoint, her cough has gotten better, but she still desaturates to the mid 80s and sometimes as low as 83%.  She is noted to have an ANA of 1-12 80 and an SSA of 185.  She does not have dry eyes, dry mouth.  She has reflux.  She is noted to have a Raynaud phenomenon.  Abnormal capillary nail fold bilateral hand synovitis, raising suspicion for connective tissue disease.  She was also seeing Dr. Mike Craze from Neurology for facial numbness and he wonders if this is due to a nerve damage from a root canal and she is to be having an EMG in May. We have several issues:1.	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  There are several other issues.2.	In terms of etiology, she does have some reflux.  I would like to get a barium esophagram.  She is currently on anti-reflux therapy.3.	In terms of treatment, at this point, I would like to talk to Dr. Rubye Beach about increasing her azathioprine to 150 mg a day as well as maintain the prednisone at 20 mg a day for a bit longer.  I do wonder about some concern for renal crisis given that scleroderma like diagnosis has been in the differential, but she has seemed to have tolerated prednisone already for several months and I would like to get a CAT scan in May to see if there is any improvement.4.	I  would like to add Bactrim for PJP prophylaxis.  5.	I would like to do an echocardiogram to rule out pulmonary hypertension.6.	I would like to refer her to pulmonary rehabilitation.7.	We had a long conversation about prognosis and I said I do wonder from looking at her CAT scan features if there is evidence of inflammatory disease that may respond to steroids.  Secondary immunosuppressive agents do take some time and she was only started on a lower dose of prednisone.  I would like to see how she does in a few months.  We can also see if there is any evidence of fibrotic changes and make an assessment about whether or not antifibrotics will be useful.8.	We briefly discussed lung transplant.  I will continue that conversation. 9.	She does need a 1 cm right middle lobe lung nodule to be followed up as well as her adenopathy. 10.	I will discuss with her on the phone next week in April and also see her in June the week before she should get her CAT scan at that time.06/2021 [place] appears mostly inflammatory. She started on higher dose Prednisone after this scan and I feel we should wait an additional 2-3 months to allow for additional potential improvement. sShe is scheduled for EMG for facial numbness.Her pulmonary function test showed profound impairment although she was unable to adhere to standards for an appropriate diffusion.  She does of note require oxygen.Recommendations:Plan/Orders:1. Review at ILD conference2. Barium esophagram. 3. Continue Prednisone 20 mg.4. Continue azathioprine-- Consider increasing dosage w Dr. Rubye Beach.5. Start bactrim tiw-- PJP prophylaxis.6. Echocardiogram.7. Pulmonary rehabilitation referral.OtherScribed for Stark Klein, MD by Dellia Cloud, medical scribe Sep 06, 2021  The documentation recorded by the scribe accurately reflects the services I personally performed and the decisions made by me. I reviewed and confirmed all material entered and/or pre-charted by the scribe. Interim 5/3/2023Increase to aza 150 q day 4/18 no side effectsEsophagram and Echo are scheduled for 5/17Bactrim three times a week 2 weeksEMG for facial numbness on 5/16 scheduledPulmonary rehabilitation on 5/16Follow up with Dr. Rubye Beach 5/17CT chest will order follow up Will see again in June.Referral to lung transplant

## 2021-10-10 ENCOUNTER — Ambulatory Visit: Admit: 2021-10-10 | Payer: BLUE CROSS/BLUE SHIELD | Attending: Critical Care Medicine

## 2021-10-10 DIAGNOSIS — J849 Interstitial pulmonary disease, unspecified: Secondary | ICD-10-CM

## 2021-10-11 ENCOUNTER — Ambulatory Visit: Admit: 2021-10-11 | Payer: BLUE CROSS/BLUE SHIELD

## 2021-10-13 ENCOUNTER — Telehealth: Admit: 2021-10-13 | Payer: PRIVATE HEALTH INSURANCE | Attending: Critical Care Medicine

## 2021-10-13 NOTE — Telephone Encounter
Johnny says Dr. Reece Agar asked her to call today on her medication.  Call back [phone].  Also asked about short term disability form if it was returned.

## 2021-10-13 NOTE — Telephone Encounter
I spoke to the patient.She is currently on prednisone 20 mg daily and is wondering if Dr. Cheri Rous wanted to increase the prednisone.Also she said that more disability forms were going to be faxed over.Electronically Signed by Jabier Gauss, RN, October 13, 2021

## 2021-10-16 ENCOUNTER — Encounter: Admit: 2021-10-16 | Payer: BLUE CROSS/BLUE SHIELD | Attending: Vascular and Interventional Radiology

## 2021-10-17 ENCOUNTER — Encounter: Admit: 2021-10-17 | Payer: BLUE CROSS/BLUE SHIELD | Attending: Internal Medicine

## 2021-10-21 LAB — MYOSITIS SPECIFIC 11 AB PANEL     (BH GH LMW Q YH)
ANTI-EJ AB: NEGATIVE
ANTI-JO-1 AB: <20 Units (ref <20)
ANTI-KU AB: NEGATIVE
ANTI-MDA-5 AB: <20 Units — ABNORMAL HIGH (ref <20)
ANTI-MI-2-AB: NEGATIVE
ANTI-NXP-2 (P140) AB: <20 Units (ref <20)
ANTI-OJ AB: NEGATIVE
ANTI-PL-12 AB: NEGATIVE — AB
ANTI-PL-7 AB: NEGATIVE
ANTI-PM/SCL-100 AB: <20 Units (ref <20)
ANTI-SRP AB: NEGATIVE mg/dL (ref 10–40)
ANTI-SS-A 52KD AB, IGG: 216 Units — ABNORMAL HIGH (ref <20)
ANTI-TIF-1GAMMA AB: <20 Units (ref <20)
ANTI-U1 RNP AB: <20 Units (ref <20)
ANTI-U2 RNP AB: NEGATIVE
ANTI-U3 RNP (FIBRILLARIN): NEGATIVE

## 2021-10-21 LAB — FUNGAL CULTURE
BKR FUNGAL CULTURE: NO GROWTH
BKR FUNGAL PERIODIC ACID SCHIFF: NEGATIVE

## 2021-10-25 NOTE — Telephone Encounter
Pt  Calling back regarding medication prednisone please see message below, also pt said that short term disability did not received the paperwork, they sent a fax request. Fax number [phone]

## 2021-10-25 NOTE — Telephone Encounter
Pt will be dropping off the [place] leave paperwork on Monday, she will complete what she needs to do prior to dropping it off

## 2021-10-25 NOTE — Telephone Encounter
Contacted the pt and relayed to her that she should decrease her prednisone to 15 mg per message from Dr Cheri Rous, scheduled phone visit as directed by Dr Epimenio Foot team I reviewed [place] scan with radiology and allthe bronch result I'd like to have her go down to prednisone 15 mg/day I'd like to touch base with her about next steps next week. Can she do a quick call at 800 on 6/28

## 2021-10-25 NOTE — Progress Notes
Teresa Watson, M.D., M.P.H.Associate Professor of MedicineAssociate Director, Interstitial Lung Disease ProgramOccupational & Environmental MedicineWinchester Chest Clinic, [facility] [address] [place], [place] [phone] (p)Patient Name:  Teresa Watson of Birth:  02/24/1972Date of Service: 6/6/2023EPIC MRN:  [id number] Provider:  Stark Watson, MDVIDEO TELEHEALTH VISIT: This clinician is part of the telehealth program and is conducting this visit in a currently approved location. For this visit the clinician and patient were present via interactive audio & video telecommunications system that permits real-time communications, via the [employer].Patient's use of the telehealth platform followed consent and acknowledges agreement to permit telehealth for this visit. State patient is located in: [place]The clinician is appropriately licensed in the above state to provide care for this visit. Other individuals present during the telehealth encounter and their role/relation: noneInterim History 10/10/2021:This patient is presenting for follow up as it regards her ILD, last visit on 09/06/2021. She is on Prednisone 20 mg and recently increased dosage of azathioprine to 75 mg with bactrim tiw. She feels her cough has improved significantly since starting medication. Her SOB does not bother her while using 3L supplementary O2. She has been unable to schedule pulmonary rehabilitation appointments as of yet.The patient reports her reflux has been relatively well-controlled on medication. She does feel as though something is stuck in her throat and 09/20/2021 esophagram warrants GI follow up. She did miss a scheduled appointment. Echocardiogram was negative for pulmonary HTN. EMG was thought to be normal. [place] chest on 09/23/2021 shows some interval worsening. The patient is currently in [place].GLO:VFIEP: Positive SOB.HENT: Positive dysphagia.Review of systems is otherwise negative.Outpatient Medications Marked as Taking for the 10/10/21 encounter (Telemedicine) with Teresa Klein, MD Medication Sig Dispense Refill ? azaTHIOprine (AZASAN/IMURAN) 75 mg tablet Take 2 tablets (150 mg total) by mouth 2 (two) times daily. 60 tablet 2 ? ergocalciferol (ERGOCALCIFEROL) 1,250 mcg (50,000 unit) capsule Take 1 capsule (50,000 Units total) by mouth once a week. 4 capsule 2 ? home oxygen - outpatient 3 L/min by Nasal route continuous. Including Portability and ConcentratorPt has been using 3L lately   ? levonorgestreL (MIRENA) IUD 1 each by Intrauterine route once.   ? omeprazole (PRILOSEC) 40 mg capsule TAKE 1 CAPSULE (40 MG TOTAL) BY MOUTH DAILY. 30 capsule 2 ? predniSONE (DELTASONE) 20 mg tablet TAKE 2 TABS BY MOUTH DAILY FOR 7 DAYS THEN 1 TAB DAILY FOR 30 DAYS TAKE WITH FOOD (Patient taking differently: Take 1 tablet (20 mg total) by mouth daily.) 90 tablet 1 ? sulfamethoxazole-trimethoprim (BACTRIM DS;CO-TRIMOXAZOLE DS) 800-160 mg per tablet Take 1 tablet by mouth 3 (three) times a week. 36 tablet 3 No Known AllergiesImmunization History Administered Date(s) Administered ? COVID-19 Vaccine - PFIZER 08/24/2019, 09/26/2019 ? Influenza, seasonal trivalent, adjuvanted, preservative free 03/26/2013 ? Influenza, unspecified formulation 06/17/2015, 05/17/2016 ? Pneumococcal conjugate 20 valent (PCV20) 08/21/2021 TELEPHONE VISIT: For this visit the clinician and patient were present via telephone (audio only).Patient counseled on available options for visit type; Patient elected telephone visit; Patient consent given for telephone visit: YesPatient Identity was confirmed during this call.  Other individuals actively participating in the telephone encounter and their name/relation to the patient: noneTotal time spent in medical telephone consultation: 15Because this visit was completed over telephone, a hands-on physical exam was not performed.  Patient understands and knows to call back if condition changes._________________________INITIAL VISIT_______________________________________________________Dear Dr. No ref. provider found,I had the pleasure of seeing Teresa Watson, referred by you in consultation for ILD.History of the Present Illness:This patient  is a 51 year old female that prior to contracting covid in 11/20 reports being healthy. She was not hospitalized for her 03/2019 COVID infection. She began developing dyspnea in 2021 after moving here from [place], specifically chest tightness when sitting down to eat, with cough and SOB. Her SOB started in 02/2020 and walk-in clinic diagnosed her with walking pneumonia treated with antibiotics to limited efficacy. After additional symptomatic progression in late 2021 she saw a [place] pulmonologist who suggested 06/2020 biopsy. She states her breathing continued to worsen after this point. She had a second COVID infection in 03/2021, testing positive at the time of her ED presentation, treated with molpurinivir and steroids. She states she did not feel that sick this time.  She saw Dr. Littie Watson end of 03/2021, who thought she may have autoimmune disease and unlikely related to her COVID infection. Marland Kitchen She was referred her to neurology and rheumatology. Dr. Rubye Watson started her on prednisone  who started the patient on Prednisone 10 mg (12/1) then 5 mg. Her respiratory symptoms temporarily improve with steroids but tend to return after some time. Her first dose of Prednisone was in treatment primarily of her severe diffuse arthralgias which have improved significantly. She had a positive Sjogren's antibody. She had Cellcept starting 12/29 for 2 months, which did help her joint pains at 750 mg po bid but at some poitn when she went to 1500 mg po bid she began to vfeel weaker.   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 She remains on Prednisone 20 mg and Imuran 50 mg QD at this point. She expresses concern regarding employment, as she is unable to perform some job tasks such as talking on the phone for extended periods. Answers for HPI/ROS submitted by the patient on 4/14/2023Fatigue: YesFacial swelling: YesDental problems: YesHoarseness: YesSore Throat: YesCongestion: [name] redness: YesCough: YesShortness of breath: YesWheezing: YesNoisy breathing (Stridor): YesChest pain: YesIrregular/fast heartbeat: YesUrinating Often : YesPaleness: YesDizziness: YesHeadacheHomero FellersDolan Amen SymptomsCough yes frequent, since 1 year, but 50% better since 06/2021 Prednisone. Coughs in morning, unable to expectorate. Clear sputum.Shortness of breath on ambulation and when talking since 2021. Was progressing but now improving. Improved with steroids. SpO2 desats to 85% on ambulation w/o O2.Activity level walks 15-20 minutes, more difficult as of late. Oxygen usage 2-4LCONNECTIVE TISSUE DISEASE SYMPTOMS:Dry eye:  noDry mouth: noSkin rash:  noPhotosensitivity:  Some.Skin thickening:  noDrying/cracking of hands/fingers:  Yes, resolved starting medications. Arthralgias:  Yes, resolved since starting medications.Morning stiffness:  noMyalgias:  Yes, resolved since starting medications.Numbness/Tingling: Face. Proximal muscle weakness:  Yes, resolved since starting medications.Raynaud's:  Yes, improved since starting medications. Reflux/regurgitation:  Yes. Taking Omeprazole which helps. Dysphagia:  noOther:  Some forgetfulness. Hoarse voice (going to see ENT).  MEDICATION HISTORY RELATED TO ZOX:[id number]:   noNitrofurantoin:   noMethotrexate:  No?Other:The remainder of the review of systems is negative in detail.PAST MEDICAL HISTORYPAST SURGICAL HISTORYALLERGIESFAMILY HISTORYSOCIAL HISTORY  Past Medical History:Not sure if screened for TB previously. Past Surgical History:Cholecystectomy.Breast reductionC-section x2.Bronch.HEALTH MAINTENANCE:Flu shot:  noCOVID: x2, last in 09/2019. No symptoms s/p vaccination. Prevnar:Pneumococcal: noShingrix:DXA:  Immunization History Administered Date(s) Administered ? COVID-19 Vaccine - PFIZER 08/24/2019, 09/26/2019 ? Influenza, seasonal trivalent, adjuvanted, preservative free 03/26/2013 ? Influenza, unspecified formulation 06/17/2015, 05/17/2016 ? Pneumococcal conjugate 20 valent (PCV20) 08/21/2021 Medications:Outpatient Medications Marked as Taking for the 10/10/21 encounter (Telemedicine) with Teresa Klein, MD Medication Sig Dispense Refill ? azaTHIOprine (AZASAN/IMURAN) 75 mg tablet Take 2 tablets (150 mg total) by mouth 2 (two) times daily.  60 tablet 2 ? ergocalciferol (ERGOCALCIFEROL) 1,250 mcg (50,000 unit) capsule Take 1 capsule (50,000 Units total) by mouth once a week. 4 capsule 2 ? home oxygen - outpatient 3 L/min by Nasal route continuous. Including Portability and ConcentratorPt has been using 3L lately   ? levonorgestreL (MIRENA) IUD 1 each by Intrauterine route once.   ? omeprazole (PRILOSEC) 40 mg capsule TAKE 1 CAPSULE (40 MG TOTAL) BY MOUTH DAILY. 30 capsule 2 ? predniSONE (DELTASONE) 20 mg tablet TAKE 2 TABS BY MOUTH DAILY FOR 7 DAYS THEN 1 TAB DAILY FOR 30 DAYS TAKE WITH FOOD (Patient taking differently: Take 1 tablet (20 mg total) by mouth daily.) 90 tablet 1 ? sulfamethoxazole-trimethoprim (BACTRIM DS;CO-TRIMOXAZOLE DS) 800-160 mg per tablet Take 1 tablet by mouth 3 (three) times a week. 36 tablet 3 Ms. Bias has never taken amiodarone or nitrofurantoin.Allergies:No Known AllergiesFamily History:Family History Problem Relation Age of Onset ? Hypertension Mother  ? Diabetes Mother  ? Glaucoma Mother  ? Prostate cancer Father  ? Peripheral vascular disease Father  ? Lupus Sister  ? Thyroid disease Sister  ? No Known Problems Brother  ILD FHxLung disease: noLiver disease: noConnective tissue disease: Early grey hair:  Father? Anemias/leukemias:Lung cancer: noRheumatologic History: Sister with lupus, other sister with Sjogren's. Daughter with thyroid. Maternal grandmother with RA.Other: Social History:Social History Socioeconomic History ? Marital status: Married Tobacco Use ? Smoking status: Never ? Smokeless tobacco: Never Vaping Use ? Vaping Use: Never used Substance and Sexual Activity ? Alcohol use: Never ? Drug use: Never Smoking: none.Alcohol: none.Drugs: NoneMarried: yesChildren: x3, heatlhyTravel History: [place].Prior places of residence (international):Other:  OCCUPATIONAL AND ENVIROMENTAL HISTORY OCCUPATIONAL AND ENVIRONMENTAL HISTORY:Job Title: Remote work from home. Employer: [employer]. Asbestos:  noSilica:  noMetal dust:  noMetalworking fluids:  noWood dust:  NoAgricultural/farming:  noPets: noBird exposure: noFeather products: noMold exposure/water damage: noHot tub: noHumidifier: noHeating/cooling system: AC cleaned x2 per year.Other hobbies or activities of concern: noAny dusts gases/vapors or fumes in workplace not specified above:Any dust gases/vapors or fumes at home (hobbies etc) not specified above:PHYSICAL EXAMINATION Physical Examination:No physical exam was performed due to the nature of the visit.Pulmonary Studies Pulmonary Function Testing:Date FVC FEV1 FEV1/FVC TLC DLCO (hgb) 08/21/2021 1.12 (38%) 1.00 (42%) 89% 2.00 (45%) 1.65 (9%) 06/20/2020 PFT: TLC, FVC 64%; FEV1 71%; diffusion 41%- corrects to normal. 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 Clinical correlation recommended.Stable 1 cm right middle lobe nodule. Continued follow-up recommended per protocol.Worsened mediastinal and hilar adenopathy.03/12/2021 [facility] Chest PE:1.         No evidence of pulmonary embolism.2.         Diffuse bilateral groundglass opacification in a somewhat peripheral predominant distribution, which may represent sequela of prior infection or developing fibrosis although active infection cannot be excluded.3.         1.0 cm consolidation in the right middle lobe, for which follow-up [place] chest is recommended to ensure resolution.4.         Bilateral hilar and mediastinal lymphadenopathy, likely reactive.Chest Xray5/18/2023 XR Chest PA or AP:No pneumothorax following left lung biopsy.Lower lobe predominant diffuse hazy and reticular opacities.06/22/2021 XR Chest PA and Lateral:Findings concerning for bibasilar pneumonia.08/10/2020 XR Chest 2 views:Comparison: 1/12/2022No change compared to prior examination. Prominent interstitial  markings more pronounced in the lung bases. Findings may represent atypical pneumonitis. 07/01/2020 XR Chest:Bilateral interstitial opacities. Differential includes interstitial edema, reactive airway disease, atypical bacterial or viral infection.OTHERVQ SSurgical Biopsy3/05/2020 Surgical Pathology Lung, GNF:AOZH tissue with chronic inflammation and sclerosis with features of organizing pneumonia. 09/20/2021 [place] Esophagram:No evidence for barium aspiration.Mild esophageal dilatation with retained secretions. Diminished peristaltic activity and incomplete relaxation of the lower esophageal sphincter suggesting achalasia.Prominent longitudinal esophageal mucosal folds and feline mucosal pattern.Recommend direct visualization.Cardiac Studies Cardiac studies:EKGComponent    Latest Ref Rng 11/19/2020 03/12/2021 06/22/2021 Heart Rate    bpm 90  81  82  SEVERITY    severity Normal ECG  Normal ECG  Normal ECG  QRS Interval    ms   90  QT Interval    ms   332  QTC Interval    ms   387  P Axis    deg   31  QRS Axis    deg   18  T Wave Axis    deg   19  P-R Interval    msec   172  Echocardiogram5/17/2023 Echo:~ * Normal left ventricular size, wall thickness, systolic function and wall motion. LVEF calculated by biplane Simpson's was 58%.  Mild diastolic dysfunction consistent with impaired relaxation with low to normal filling pressure (grade 1).~ * Normal right ventricular cavity size and systolic function.~ * Trace mitral regurgitation.~ * Trace tricuspid regurgitation with normal right heart pressures (26 mmHg).~ * No pericardial effusion.~ * No prior study available for comparison.Laboratory Studies Lab Results Component Value Date  WBC 9.2 06/22/2021  HGB 13.2 06/22/2021  HCT 39.50 06/22/2021  MCV 87.2 06/22/2021  PLT 359 06/22/2021 Lab Results Component Value Date  NA 141 07/11/2021  K 4.1 07/11/2021  CL 104 07/11/2021  CO2 27 07/11/2021 Lab Results Component Value Date  BUN 8 07/11/2021 Lab Results Component Value Date  CREATININE 0.59 07/11/2021 Lab Results Component Value Date  ALT 10 07/11/2021  AST 21 07/11/2021  ALKPHOS 58 07/11/2021  BILITOT 0.6 07/11/2021 Immunology	 Latest Reference Range & Units 04/06/21 17:03 07/11/21 09:56 Immunofixation Electrophoresis Gel   Normal immunofixation electrophoresis. No evidence of a serum monoclonal component. Kappa Free Light Chains, Serum 0.33 - 1.94 mg/dL  0.86 (H) Lambda Free Light Chains, Serum 0.57 - 2.63 mg/dL  5.78 Free Kappa/Lambda Ratio 0.26 - 1.65   1.17 C3 Complement 90 - 180 mg/dL 469  C4 Complement 10 - 40 mg/dL 33  Rheumatoid Factor <62.9 IU/mL <10.0  Scl-70 Antibody  <1.0  Sjogren's Antibody (SS-A)  >8.0 (H)  ANA <1:80 Negative  Positive ! Positive ! ANA Titer <1:80 Negative  1:1280 ! 1:1280 ! ANA Pattern  Speckled Speckled Sm/RNP Antibody  <1.0  dsDNA Ab <30 IU/mL IU/mL <12.3 <12.3 ANCA Screen Negative  Negative Negative Myeloperoxidase Ab <1.0 AI <1.0  Proteinase-3 Antibody <1.0 AI <1.0  (H): Data is abnormally high!: Data is abnormal Latest Reference Range & Units 04/06/21 17:03 Anti-EJ Ab Negative  Negative Anti-Jo-1 Ab <20 Units <20 Anti-Ku Ab Negative  Negative Anti-MDA-5 Ab  <20 Units <20 Anti-Mi-2-Ab Negative  Negative Anti-NXP-2 (P140) Ab <20 Units <20 Anti-OJ Ab Negative  Negative Anti-PL-12 Ab Negative  Negative Anti-PL-7 Ab Negative  Negative Anti-PM/Scl-100 Ab <20 Units <20 Anti-SRP Ab Negative  Negative Anti-SS-A 52kD Ab, IgG <20 Units 185 (H) Anti-TIF-1gamma Ab <20 Units <20 Anti-U1 RNP Ab <20 Units <20 Anti-U2 RNP Ab Negative  Negative Anti-U3 RNP (Fibrillarin) Negative  Negative Beta-2 Glycoprotein Ab, IgG <7.0 U/mL 1.0  CCP Ab, IgG <7.0 EliA U/mL 0.8 Centromere B Antibody  <1.0 RNA Polymerase III Antibody, IgG <20 Units <20 TPMT Activity  16 (H): Data is abnormally high\ Latest Reference Range & Units 04/19/21 08:52 HIV 1/2 Antibody Screen Non-Reactive  Non-Reactive Hepatitis C Antibody Non-Reactive  Non-Reactive Hepatitis B core IgM ab Component 06/01/2020   Hepatitis B core IgM ab NON-REACTIVE COMMENT/ RECOMMENDATIONS Comment:Ms. Santarelli is a 51 year old woman with no previous medical history, who is referred for an evaluation of interstitial lung disease.  She has had 2 bouts of COVID infection including November 2020 as well as November 2022, which were relatively mild with the 2nd one, for which she was given lopinavir.  After her 1st COVID infection approximately a year later, she presented with dyspnea and went to an Urgent Care.  She ultimately saw a pulmonologist in February 2022 and had a bronchoscopy with transbronchial biopsies and was told that she potentially had organizing pneumonia.  She had moved to [place] in 2021.  She returned given that she has concerns about her health care.  She had a 2nd bout of COVID infection in November of 2022 and ended up seeing Dr. Littie Watson at the end of the month.  She was started on prednisone 10 mg by Dr. Rubye Watson.  Eventually saw Dr. Rubye Watson in December, was started on CellCept, which she took for approximately a couple of months, which helped some of her joint pains and myalgias that she had at the time.  When she got to 1500 twice a day, she started feeling weaker and ultimately was transitioned over to azathioprine 50 mg twice a day.  She was given also in February.  Her prednisone was increased to 40 mg a day and she is currently on 20 mg a day.  She feels that from a respiratory standpoint, her cough has gotten better, but she still desaturates to the mid 80s and sometimes as low as 83%.  She is noted to have an ANA of 1-12 80 and an SSA of 185.  She does not have dry eyes, dry mouth.  She has reflux.  She is noted to have a Raynaud phenomenon.  Abnormal capillary nail fold bilateral hand synovitis, raising suspicion for connective tissue disease.  She was also seeing Dr. Mike Craze from Neurology for facial numbness and he wonders if this is due to a nerve damage from a root canal and she is to be having an EMG in May. We have several issues:1.	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  There are several other issues.2.	In terms of etiology, she does have some reflux.  I would like to get a barium esophagram.  She is currently on anti-reflux therapy.3.	In terms of treatment, at this point, I would like to talk to Dr. Rubye Watson about increasing her azathioprine to 150 mg a day as well as maintain the prednisone at 20 mg a day for a bit longer.  I do wonder about some concern for renal crisis given that scleroderma like diagnosis has been in the differential, but she has seemed to have tolerated prednisone already for several months and I would like to get a CAT scan in May to see if there is any improvement.4.	I would like to add Bactrim for PJP prophylaxis.  5.	I  would like to do an echocardiogram to rule out pulmonary hypertension.6.	I would like to refer her to pulmonary rehabilitation.7.	We had a long conversation about prognosis and I said I do wonder from looking at her CAT scan features if there is evidence of inflammatory disease that may respond to steroids.  Secondary immunosuppressive agents do take some time and she was only started on a lower dose of prednisone.  I would like to see how she does in a few months.  We can also see if there is any evidence of fibrotic changes and make an assessment about whether or not antifibrotics will be useful.8.	We briefly discussed lung transplant.  I will continue that conversation. 9.	She does need a 1 cm right middle lobe lung nodule to be followed up as well as her adenopathy. 10.	I will discuss with her on the phone next week in April and also see her in June the week before she should get her CAT scan at that time.06/2021 [place] appears mostly inflammatory. She started on higher dose Prednisone after this scan and I feel we should wait an additional 2-3 months to allow for additional potential improvement. sShe is scheduled for EMG for facial numbness.Her pulmonary function test showed profound impairment although she was unable to adhere to standards for an appropriate diffusion.  She does of note require oxygen.Recommendations:Plan/Orders:1. Review at ILD conference2. Barium esophagram. 3. Continue Prednisone 20 mg.4. Continue azathioprine-- Consider increasing dosage w Dr. Rubye Watson.5. Start bactrim tiw-- PJP prophylaxis.6. Echocardiogram.7. Pulmonary rehabilitation referral.OtherScribed for Teresa Klein, MD by Dellia Cloud, medical scribe October 10, 2021  The documentation recorded by the scribe accurately reflects the services I personally performed and the decisions made by me. I reviewed and confirmed all material entered and/or pre-charted by the scribe. Interim 5/3/2023Increase to aza 150 q day 4/18 no side effectsEsophagram and Echo are scheduled for 5/17Bactrim three times a week 2 weeksEMG for facial numbness on 5/16 scheduledPulmonary rehabilitation on 5/16Follow up with Dr. Rubye Watson 5/17CT chest will order follow up Will see again in June.Referral to lung transplantInterim Plan 10/10/2021:09/23/2021 [place] chest shows some interval worsening. She has been on azathioprine for 3-4 months at this point. She previously had symptomatic improvement on higher doses of Prednisone. She could not tolerate mycophenolate due to side effects of arthralgias/myalgias. We also discussed the risks/benefits of antifibrotics and she would be amenable to starting.1. GI appointment.2. Pursue pulmonary rehabilitation. 3. Review at multidisciplinary conference-- consider higher dose Prednisone/azathioprine. 4. Continue regular screening labs.  Scribed for Teresa Klein, MD by Dellia Cloud, medical scribe June 6, 2023The documentation recorded by the scribe accurately reflects the services I personally performed and the decisions made by me. I reviewed and confirmed all material entered and/or pre-charted by the scribe.

## 2021-10-30 ENCOUNTER — Encounter: Admit: 2021-10-30 | Payer: PRIVATE HEALTH INSURANCE | Attending: Critical Care Medicine

## 2021-10-30 NOTE — Telephone Encounter
Spoke to [place], reminder provided to submit Disability paperwork ASAP Lamika apologized and states she will scan into [place] today or tomorrow  Electronically Signed by Fara Boros, RN, October 30, 2021

## 2021-11-01 ENCOUNTER — Ambulatory Visit: Admit: 2021-11-01 | Payer: BLUE CROSS/BLUE SHIELD | Attending: Critical Care Medicine

## 2021-11-01 ENCOUNTER — Encounter: Admit: 2021-11-01 | Payer: PRIVATE HEALTH INSURANCE | Attending: Critical Care Medicine

## 2021-11-01 ENCOUNTER — Encounter: Admit: 2021-11-01 | Payer: PRIVATE HEALTH INSURANCE

## 2021-11-01 DIAGNOSIS — J8410 Pulmonary fibrosis, unspecified: Secondary | ICD-10-CM

## 2021-11-01 DIAGNOSIS — K21 Gastroesophageal reflux disease with esophagitis, unspecified whether hemorrhage: Secondary | ICD-10-CM

## 2021-11-01 DIAGNOSIS — J849 Interstitial pulmonary disease, unspecified: Secondary | ICD-10-CM

## 2021-11-01 MED ORDER — PREDNISONE 5 MG TABLET
5 mg | ORAL_TABLET | ORAL | 2 refills | Status: AC
Start: 2021-11-01 — End: ?

## 2021-11-01 MED ORDER — NINTEDANIB 150 MG CAPSULE
150 mg | ORAL_CAPSULE | Freq: Two times a day (BID) | ORAL | 12 refills | 30.00 days | Status: AC
Start: 2021-11-01 — End: ?

## 2021-11-01 MED ORDER — FAMOTIDINE 20 MG TABLET
20 mg | ORAL_TABLET | Freq: Every evening | ORAL | 2 refills | Status: AC
Start: 2021-11-01 — End: 2021-12-28

## 2021-11-01 MED ORDER — BENZONATATE 200 MG CAPSULE
200 mg | ORAL_CAPSULE | Freq: Three times a day (TID) | ORAL | 4 refills | Status: AC | PRN
Start: 2021-11-01 — End: ?

## 2021-11-01 NOTE — Progress Notes
Stark Klein, M.D., M.P.H.Associate Professor of MedicineAssociate Director, Interstitial Lung Disease ProgramOccupational & Environmental MedicineWinchester Chest Clinic, [facility] [address], [address] [place], [place] [phone] (p)Patient Name:  Teresa Watson of Birth:  October 01, 1972Date of Service: 6/28/2023EPIC MRN:  [id number] Provider:  Stark Klein, MDTELEPHONE VISIT: For this visit the clinician and patient were present via telephone (audio only).Patient counseled on available options for visit type; Patient elected telephone visit; Patient consent given for telephone visit: YesPatient Identity was confirmed during this call.  Other individuals actively participating in the telephone encounter and their name/relation to the patient: noneTotal time spent in medical telephone consultation: 15Because this visit was completed over telephone, a hands-on physical exam was not performed.  Patient understands and knows to call back if condition changes.Interim history 6/28/2023See below _________________________INITIAL VISIT_______________________________________________________Dear Dr. No ref. provider found,I had the pleasure of seeing Teresa Watson, referred by you in consultation for ILD.History of the Present Illness:This patient is a 51 year old female that prior to contracting covid in 11/20 reports being healthy. She was not hospitalized for her 03/2019 COVID infection. She began developing dyspnea in 2021 after moving here from [place], specifically chest tightness when sitting down to eat, with cough and SOB. Her SOB started in 02/2020 and walk-in clinic diagnosed her with walking pneumonia treated with antibiotics to limited efficacy. After additional symptomatic progression in late 2021 she saw a [place] pulmonologist who suggested 06/2020 biopsy. She states her breathing continued to worsen after this point. She had a second COVID infection in 03/2021, testing positive at the time of her ED presentation, treated with molpurinivir and steroids. She states she did not feel that sick this time.  She saw Dr. Littie Deeds end of 03/2021, who thought she may have autoimmune disease and unlikely related to her COVID infection. Marland Kitchen She was referred her to neurology and rheumatology. Dr. Rubye Beach started her on prednisone  who started the patient on Prednisone 10 mg (12/1) then 5 mg. Her respiratory symptoms temporarily improve with steroids but tend to return after some time. Her first dose of Prednisone was in treatment primarily of her severe diffuse arthralgias which have improved significantly. She had a positive Sjogren's antibody. She had Cellcept starting 12/29 for 2 months, which did help her joint pains at 750 mg po bid but at some poitn when she went to 1500 mg po bid she began to vfeel weaker.   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 She remains on Prednisone 20 mg and Imuran 50 mg QD at this point. She expresses concern regarding employment, as she is unable to perform some job tasks such as talking on the phone for extended periods. Answers for HPI/ROS submitted by the patient on 4/14/2023Fatigue: YesFacial swelling: YesDental problems: YesHoarseness: YesSore Throat: YesCongestion: [name] redness: YesCough: YesShortness of breath: YesWheezing: YesNoisy breathing (Stridor): YesChest pain: YesIrregular/fast heartbeat: YesUrinating Often : YesPaleness: YesDizziness: YesHeadacheHomero FellersDolan Amen SymptomsCough yes frequent, since 1 year, but 50% better since 06/2021 Prednisone. Coughs in morning, unable to expectorate. Clear sputum.Shortness of breath on ambulation and when talking since 2021. Was progressing but now improving. Improved with steroids. SpO2 desats to 85% on ambulation w/o O2.Activity level walks 15-20 minutes, more difficult as of late. Oxygen usage 2-4LCONNECTIVE TISSUE DISEASE SYMPTOMS:Dry eye:  noDry mouth: noSkin rash:  noPhotosensitivity:  Some.Skin thickening:  noDrying/cracking of hands/fingers:  Yes, resolved starting medications. Arthralgias:  Yes, resolved since starting medications.Morning stiffness:  noMyalgias:  Yes, resolved since  starting medications.Numbness/Tingling: Face. Proximal muscle weakness:  Yes, resolved since starting medications.Raynaud's:  Yes, improved since starting medications. Reflux/regurgitation:  Yes. Taking Omeprazole which helps. Dysphagia:  noOther:  Some forgetfulness. Hoarse voice (going to see ENT).  MEDICATION HISTORY RELATED TO WUJ:[id number]:   noNitrofurantoin:   noMethotrexate:  No?Other:The remainder of the review of systems is negative in detail.PAST MEDICAL HISTORYPAST SURGICAL HISTORYALLERGIESFAMILY HISTORYSOCIAL HISTORY  Past Medical History:Not sure if screened for TB previously. Past Surgical History:Cholecystectomy.Breast reductionC-section x2.Bronch.HEALTH MAINTENANCE:Flu shot:  noCOVID: x2, last in 09/2019. No symptoms s/p vaccination. Prevnar:Pneumococcal: noShingrix:DXA:  Immunization History Administered Date(s) Administered ? COVID-19 Vaccine - PFIZER 08/24/2019, 09/26/2019 ? Influenza, seasonal trivalent, adjuvanted, preservative free 03/26/2013 ? Influenza, unspecified formulation 06/17/2015, 05/17/2016 ? Pneumococcal conjugate 20 valent (PCV20) 08/21/2021 Medications:No outpatient medications have been marked as taking for the 11/01/21 encounter (Appointment) with Stark Klein, MD. Ms. Mcclane has never taken amiodarone or nitrofurantoin.Allergies:No Known AllergiesFamily History:Family History Problem Relation Age of Onset ? Hypertension Mother  ? Diabetes Mother  ? Glaucoma Mother  ? Prostate cancer Father  ? Peripheral vascular disease Father  ? Lupus Sister  ? Thyroid disease Sister  ? No Known Problems Brother  ILD FHxLung disease: noLiver disease: noConnective tissue disease: Early grey hair:  Father? Anemias/leukemias:Lung cancer: noRheumatologic History: Sister with lupus, other sister with Sjogren's. Daughter with thyroid. Maternal grandmother with RA.Other: Social History:Social History Socioeconomic History ? Marital status: Married Tobacco Use ? Smoking status: Never ? Smokeless tobacco: Never Vaping Use ? Vaping Use: Never used Substance and Sexual Activity ? Alcohol use: Never ? Drug use: Never Smoking: none.Alcohol: none.Drugs: NoneMarried: yesChildren: x3, heatlhyTravel History: [place].Prior places of residence (international):Other:  OCCUPATIONAL AND ENVIROMENTAL HISTORY OCCUPATIONAL AND ENVIRONMENTAL HISTORY:Job Title: Remote work from home. Employer: [employer]. Asbestos:  noSilica:  noMetal dust:  noMetalworking fluids:  noWood dust:  NoAgricultural/farming:  noPets: noBird exposure: noFeather products: noMold exposure/water damage: noHot tub: noHumidifier: noHeating/cooling system: AC cleaned x2 per year.Other hobbies or activities of concern: noAny dusts gases/vapors or fumes in workplace not specified above:Any dust gases/vapors or fumes at home (hobbies etc) not specified above:PHYSICAL EXAMINATION Physical Examination:telehealthPulmonary Studies Pulmonary Function Testing:Date FVC FEV1 FEV1/FVC TLC DLCO (hgb) 08/21/2021 1.12 (38%) 1.00 (42%) 89% 2.00 (45%) 1.65 (9%) 06/20/2020 PFT: TLC, FVC 64%; FEV1 71%; diffusion 41%- corrects to normal. 08/21/2021 6MWT:Chest RadiologyChest CT2/16/2023 CTA Chest PE:No evidence of pulmonary embolus.Peripheral reticular and lower zone groundglass opacities are nonspecific, though appear worse in comparison to the 03/12/2021 [place] examination; consider superimposed infectious/inflammatory process and exacerbated interstitial lung disease. Clinical correlation recommended.Stable 1 cm right middle lobe nodule. Continued follow-up recommended per protocol.Worsened mediastinal and hilar adenopathy.03/12/2021 CTA Chest PE:1.         No evidence of pulmonary embolism.2.         Diffuse bilateral groundglass opacification in a somewhat peripheral predominant distribution, which may represent sequela of prior infection or developing fibrosis although active infection cannot be excluded.3.         1.0 cm consolidation in the right middle lobe, for which follow-up  chest is recommended to ensure resolution.4.         Bilateral hilar and mediastinal lymphadenopathy, likely reactive.Chest Xray2/16/2023 XR Chest PA and Lateral:Findings concerning for bibasilar pneumonia.08/10/2020 XR Chest 2 views:Comparison: 1/12/2022No change compared to prior examination. Prominent interstitial markings more pronounced in the lung bases. Findings may represent atypical pneumonitis. 07/01/2020 XR Chest:Bilateral interstitial opacities. Differential includes interstitial edema, reactive airway disease, atypical bacterial or viral infection.VQ SSurgical Biopsy3/05/2020 Surgical Pathology Lung, FAO:ZHYQ  tissue with chronic inflammation and sclerosis with features of organizing pneumonia. 09/21/2021 Latest Reference Range & Units 09/21/21 09:01 Source  Tissue/Fluid CD3+ % 87.30 CD3+CD8+ % 88.50 CD4/CD8 Ratio  0.10 CD19+ % 0.90 CD19+KAPPA+ % 0.4 CD19+LAMBDA+ % 0.4 BALR Interpretation  The patient's BAL CD4:CD8 ratio is 0.1; the literature reference range is 0.8-2.1. Higher ratios are associated with active sarcoidosis and berylliosis, while lower ratios are associated with hypersensitivity pneumonitis. Correlation recommended.  Otherwise, mature B-cells are polyclonal, hence no evidence for B-NHL in this fluid specimen.  09/21/2021 uip positiveFINAL DIAGNOSIS Case Amended: ?10/30/2021 by He Regino Schultze, M.D. ? ? Previous Signout Date: ?09/22/2021 1. ?LEFT LUNG, BIOPSY: ? ? ?- SEND TO [facility] FOR FURTHER STUDIES ? 2. ?LEFT LUNG, CRYO BIOPSY : ? ? ? ? ? ? - LARGE AIRWAYS, BRONCHIO CENTRIC FIBROSIS/SCARRING, MILD CHRONIC INFLAMMATION, ADJACENT SEPTAL THICKENING AND FOCAL FIBRIN DEPOSITION. NEGATIVE FOR GRANULOMA OR VASCULITIS. SEE NOTE. NOTE: The above histologic changes are non-specific. The lung of patients with autoimmune diseases (including Sjogren syndrome) could show above morphological changes. A much less possible etiology is chronic hypersensitivity pneumonitis. ? In cases such ?as this I believe that the clinical correlation with imaging studies and possible rheumatologic work-up may be helpful. GASTROINTESTINAL STUDIES 09/20/2021 Esophagram:No evidence for barium aspiration.Mild esophageal dilatation with retained secretions. Diminished peristaltic activity and incomplete relaxation of the lower esophageal sphincter suggesting achalasia.Prominent longitudinal esophageal mucosal folds and feline mucosal pattern.Recommend direct visualization.Cardiac Studies Cardiac studies:EKGComponent    Latest Ref Rng 11/19/2020 03/12/2021 06/22/2021 Heart Rate    bpm 90  81  82  SEVERITY    severity Normal ECG  Normal ECG  Normal ECG  QRS Interval    ms   90  QT Interval    ms   332  QTC Interval    ms   387  P Axis    deg   31  QRS Axis    deg   18  T Wave Axis    deg   19  P-R Interval    msec   172  Laboratory Studies Lab Results Component Value Date  WBC 9.2 06/22/2021  HGB 13.2 06/22/2021  HCT 39.50 06/22/2021  MCV 87.2 06/22/2021  PLT 359 06/22/2021 Lab Results Component Value Date  NA 141 07/11/2021  K 4.1 07/11/2021  CL 104 07/11/2021  CO2 27 07/11/2021 Lab Results Component Value Date  BUN 8 07/11/2021 Lab Results Component Value Date  CREATININE 0.59 07/11/2021 Lab Results Component Value Date  ALT 10 07/11/2021  AST 21 07/11/2021  ALKPHOS 58 07/11/2021  BILITOT 0.6 07/11/2021 Immunology	 Latest Reference Range & Units 04/06/21 17:03 07/11/21 09:56 Immunofixation Electrophoresis Gel   Normal immunofixation electrophoresis. No evidence of a serum monoclonal component. Kappa Free Light Chains, Serum 0.33 - 1.94 mg/dL  1.61 (H) Lambda Free Light Chains, Serum 0.57 - 2.63 mg/dL  0.96 Free Kappa/Lambda Ratio 0.26 - 1.65   1.17 C3 Complement 90 - 180 mg/dL 045  C4 Complement 10 - 40 mg/dL 33  Rheumatoid Factor <40.9 IU/mL <10.0  Scl-70 Antibody  <1.0  Sjogren's Antibody (SS-A)  >8.0 (H)  ANA <1:80 Negative  Positive ! Positive ! ANA Titer <1:80 Negative  1:1280 ! 1:1280 ! ANA Pattern  Speckled Speckled Sm/RNP Antibody  <1.0  dsDNA Ab <30 IU/mL IU/mL <12.3 <12.3 ANCA Screen Negative  Negative Negative Myeloperoxidase Ab <1.0 AI <1.0  Proteinase-3 Antibody <1.0 AI <1.0  (H): Data is abnormally high!: Data is abnormal Latest Reference Range & Units 04/06/21  17:03 Anti-EJ Ab Negative  Negative Anti-Jo-1 Ab <20 Units <20 Anti-Ku Ab Negative  Negative Anti-MDA-5 Ab  <20 Units <20 Anti-Mi-2-Ab Negative  Negative Anti-NXP-2 (P140) Ab <20 Units <20 Anti-OJ Ab Negative  Negative Anti-PL-12 Ab Negative  Negative Anti-PL-7 Ab Negative  Negative Anti-PM/Scl-100 Ab <20 Units <20 Anti-SRP Ab Negative  Negative Anti-SS-A 52kD Ab, IgG <20 Units 185 (H) Anti-TIF-1gamma Ab <20 Units <20 Anti-U1 RNP Ab <20 Units <20 Anti-U2 RNP Ab Negative  Negative Anti-U3 RNP (Fibrillarin) Negative  Negative Beta-2 Glycoprotein Ab, IgG <7.0 U/mL 1.0 CCP Ab, IgG <7.0 EliA U/mL 0.8 Centromere B Antibody  <1.0 RNA Polymerase III Antibody, IgG <20 Units <20 TPMT Activity  16 (H): Data is abnormally high\ Latest Reference Range & Units 04/19/21 08:52 HIV 1/2 Antibody Screen Non-Reactive  Non-Reactive Hepatitis C Antibody Non-Reactive  Non-Reactive Hepatitis B core IgM ab Component 06/01/2020   Hepatitis B core IgM ab NON-REACTIVE COMMENT/ RECOMMENDATIONS Comment:Ms. Detamore is a 51 year old woman with no previous medical history, who is referred for an evaluation of interstitial lung disease.  She has had 2 bouts of COVID infection including November 2020 as well as November 2022, which were relatively mild with the 2nd one, for which she was given lopinavir.  After her 1st COVID infection approximately a year later, she presented with dyspnea and went to an Urgent Care.  She ultimately saw a pulmonologist in February 2022 and had a bronchoscopy with transbronchial biopsies and was told that she potentially had organizing pneumonia.  She had moved to [place] in 2021.  She returned given that she has concerns about her health care.  She had a 2nd bout of COVID infection in November of 2022 and ended up seeing Dr. Littie Deeds at the end of the month.  She was started on prednisone 10 mg by Dr. Rubye Beach.  Eventually saw Dr. Rubye Beach in December, was started on CellCept, which she took for approximately a couple of months, which helped some of her joint pains and myalgias that she had at the time.  When she got to 1500 twice a day, she started feeling weaker and ultimately was transitioned over to azathioprine 50 mg twice a day.  She was given also in February.  Her prednisone was increased to 40 mg a day and she is currently on 20 mg a day.  She feels that from a respiratory standpoint, her cough has gotten better, but she still desaturates to the mid 80s and sometimes as low as 83%.  She is noted to have an ANA of 1-12 80 and an SSA of 185. She does not have dry eyes, dry mouth.  She has reflux.  She is noted to have a Raynaud phenomenon.  Abnormal capillary nail fold bilateral hand synovitis, raising suspicion for connective tissue disease.  She was also seeing Dr. Mike Craze from Neurology for facial numbness and he wonders if this is due to a nerve damage from a root canal and she is to be having an EMG in May. We have several issues:1.	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  There are several other issues.2.	In terms of etiology, she does have some reflux.  I would like to get a barium esophagram.  She is currently on anti-reflux therapy.3.	In terms of treatment, at  this point, I would like to talk to Dr. Rubye Beach about increasing her azathioprine to 150 mg a day as well as maintain the prednisone at 20 mg a day for a bit longer.  I do wonder about some concern for renal crisis given that scleroderma like diagnosis has been in the differential, but she has seemed to have tolerated prednisone already for several months and I would like to get a CAT scan in May to see if there is any improvement.4.	I would like to add Bactrim for PJP prophylaxis.  5.	I would like to do an echocardiogram to rule out pulmonary hypertension.6.	I would like to refer her to pulmonary rehabilitation.7.	We had a long conversation about prognosis and I said I do wonder from looking at her CAT scan features if there is evidence of inflammatory disease that may respond to steroids.  Secondary immunosuppressive agents do take some time and she was only started on a lower dose of prednisone.  I would like to see how she does in a few months.  We can also see if there is any evidence of fibrotic changes and make an assessment about whether or not antifibrotics will be useful.8.	We briefly discussed lung transplant.  I will continue that conversation. 9.	She does need a 1 cm right middle lobe lung nodule to be followed up as well as her adenopathy. 10.	I will discuss with her on the phone next week in April and also see her in June the week before she should get her CAT scan at that time.06/2021 [place] appears mostly inflammatory. She started on higher dose Prednisone after this scan and I feel we should wait an additional 2-3 months to allow for additional potential improvement. sShe is scheduled for EMG for facial numbness.Her pulmonary function test showed profound impairment although she was unable to adhere to standards for an appropriate diffusion.  She does of note require oxygen.Recommendations:Plan/Orders:1. Review at ILD conference2. Barium esophagram. 3. Continue Prednisone 20 mg.4. Continue azathioprine-- Consider increasing dosage w Dr. Rubye Beach.5. Start bactrim tiw-- PJP prophylaxis.6. Echocardiogram.7. Pulmonary rehabilitation referral.OtherScribed for Stark Klein, MD by Dellia Cloud, medical scribe November 01, 2021  The documentation recorded by the scribe accurately reflects the services I personally performed and the decisions made by me. I reviewed and confirmed all material entered and/or pre-charted by the scribe. Interim 5/3/2023Increase to aza 150 q day 4/18 no side effectsEsophagram and Echo are scheduled for 5/17Bactrim three times a week 2 weeksEMG for facial numbness on 5/16 scheduledPulmonary rehabilitation on 5/16Follow up with Dr. Rubye Beach 5/17CT chest will order follow up Will see again in June.Referral to lung transplantInterim Plan 6/28/2023She notes that she feels better on azathioprine.  Now the cough is more in the morning. . Esophageal dysmotility and  Retained secretions noted on barium esophagram 5/2023Echo no pulm htnBronch +envisia . 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.B/c patiet does feel better for now will continue immunosuppression but will wean prednisone to off slowly and as follows as well as add Ofev for progressive pulmonary fibrosis.1) prednisone 15 mg/for 2 weeks 10 mg for 2 weeks; will reassess after2) RN-- will consider 7.5 for 2 weeks 5 mg/day3) continue aza 75  Bid for now and Bactrim tiw ppx4) GI referral for reflux and esophageal dysmotility and added Pepcid to the evening dosage and continue current PPI; reflux precautions suggested5) Meeting with txp program in lung txp in [place]6) Ofev 150 mg po bid  Referral to pharmacy7) follow up in September with PFTs8) added tessalon perles 200  mg po tid prn for cough; consider gabapentin in future

## 2021-11-02 ENCOUNTER — Telehealth
Admit: 2021-11-02 | Payer: PRIVATE HEALTH INSURANCE | Attending: Pharmacist Clinician (PhC)/ Clinical Pharmacy Specialist

## 2021-11-02 DIAGNOSIS — I7300 Raynaud's syndrome without gangrene: Secondary | ICD-10-CM

## 2021-11-02 DIAGNOSIS — Z9049 Acquired absence of other specified parts of digestive tract: Secondary | ICD-10-CM

## 2021-11-02 DIAGNOSIS — Z833 Family history of diabetes mellitus: Secondary | ICD-10-CM

## 2021-11-02 DIAGNOSIS — M3500 Sicca syndrome, unspecified: Secondary | ICD-10-CM

## 2021-11-02 DIAGNOSIS — K219 Gastro-esophageal reflux disease without esophagitis: Secondary | ICD-10-CM

## 2021-11-02 DIAGNOSIS — Z8349 Family history of other endocrine, nutritional and metabolic diseases: Secondary | ICD-10-CM

## 2021-11-02 DIAGNOSIS — Z8249 Family history of ischemic heart disease and other diseases of the circulatory system: Secondary | ICD-10-CM

## 2021-11-02 LAB — AFB CULTURE     (BH GH LMW YH)
BKR AFB CULTURE: NO GROWTH
BKR AFB STAIN: NONE SEEN

## 2021-11-02 NOTE — Telephone Encounter
PHARMACY NOTE: APPOINTMENTI have talked to Ms. Teresa Watson on 11/02/2021 at 9:41 AM to make an appointment with [facility] Pharmacist. I have scheduled the patient:Appt Type: MyChart Video visitPharmacist: Mike Craze reason/Notes:Medication name: OfevInitial AppointmentPertinent notes from referral: New StartOrder Priority: RoutineDate/Time of Appt: 7/7 @ 1PMDuration of Appt: 60 minutesAshley Nikolette Watson, CPHTMedication Management Clinic [facility] Main Phone: [phone]

## 2021-11-10 ENCOUNTER — Ambulatory Visit: Admit: 2021-11-10 | Payer: BLUE CROSS/BLUE SHIELD

## 2021-11-10 DIAGNOSIS — J8410 Pulmonary fibrosis, unspecified: Secondary | ICD-10-CM

## 2021-11-13 ENCOUNTER — Encounter: Admit: 2021-11-13 | Payer: PRIVATE HEALTH INSURANCE | Attending: Critical Care Medicine

## 2021-11-13 ENCOUNTER — Encounter: Admit: 2021-11-13 | Payer: PRIVATE HEALTH INSURANCE

## 2021-11-13 DIAGNOSIS — J8410 Pulmonary fibrosis, unspecified: Secondary | ICD-10-CM

## 2021-11-13 DIAGNOSIS — J849 Interstitial pulmonary disease, unspecified: Secondary | ICD-10-CM

## 2021-11-13 NOTE — Progress Notes
PHARMACY CLINIC CHRONIC DISEASE MANAGEMENTEncounter Date: 7/7/2023Primary Care Provider: Luster Landsberg NReferring/Attending Physician: Stark Klein, MDSubjective: Teresa Watson is a 51 y.o. year-old female who presents to clinic today independently. Patient is referred for Interstitial Lung Disease (ILD)/Pulmonary Fibrosis (PF) management.During the patient's last pulmonology visit (11/01/2021):?	Previously was on prednisone and CellCept when determining what her condition is and why her symptoms were occurring?	Did not have relief with use of CellCept ?	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	Has been feeling better with azathioprine, with cough more in the morning ?	Plan: will continue immunosuppression for now given improvement of symptoms, and wean prednisone slowly as well as add nintedanib (Ofev) for progressive pulmonary fibrosis ?	Prednisone 15mg  x 2 weeks, 10mg  x 2 weeks, then reassess - consider 7.5mg  x 2 weeks, then 5 mg daily ?	Continue azathioprine 75mg  BID and Bactrim TIW?	Start nintedanib (Ofev) 150mg  BID?	Start benzonatate 200mg  TID prn coughDuring today's visit:?	Sometimes will have to increase oxygen up to 4L when moving ?	Has not yet heard from OPS regarding nintedanib (Ofev) ?	Was not taking her famotidine as she was unsure if it was necessary, and did not want to take more medicationsInterstitial Lung Disease (ILD)/Pulmonary Fibrosis (PF)Current medications for the management of IL/PF include:?	Azathioprine (Imuran) 75mg  tablet -- take 1 tablet by mouth twice daily ?	Prednisone 5mg  tablet -- currently on 15mg  dose until next week (taper: 15mg  x 2 weeks, 10mg  x 2 weeks, then reassess - consider 7.5mg  x 2 weeks, then 5 mg daily)?	Sulfamethoxaole/trimethoprim 800/160mg  tablet -- take 1 tablet every Monday, Wednesday and Friday ?	Nintedanib (Ofev) 150mg  capsule - take 1 capsule by mouth twice daily  -- not yet startedPast therapies for the management of ILD/PF include:?	Mycophenolate (CellCept) -- had worsening joint pain/felt weaker Patient is not experiencing side effects of the current medication(s). Medication ManagementPatient presents to today?s visit without her medications. She has medications filled at [facility] in [place], [place] AND [place], [place] (depending on where she is). She does use a pill box to organize medications. She does not have assistance managing and taking medications.Medication Adherence/Cost:Patient reports fair adherence to prescribed medications with a couple missed doses in the past 2 week(s). She shares she sometimes misses her vitamins, but has phone alarms for her other maintenance medications. Patient reports the following barriers to adherence: forgetfulness. Patient denies problems affording medications. A medication history was obtained from the patient. Medication discrepancies are noted below in reconciliation. Patient is a good  historian of his medications and their indications. The following medication history discrepancies were identified:Medications added: noneMedications deleted: omegaMedications adjusted: azathioprine, prednisone Outpatient Medications Prior to Visit Medication Sig Dispense Refill ? azaTHIOprine (AZASAN/IMURAN) 75 mg tablet Take 2 tablets (150 mg total) by mouth 2 (two) times daily. 60 tablet 2 ? ergocalciferol (ERGOCALCIFEROL) 1,250 mcg (50,000 unit) capsule Take 1 capsule (50,000 Units total) by mouth once a week. 4 capsule 2 ? famotidine (PEPCID) 20 mg tablet Take 1 tablet (20 mg total) by mouth at bedtime. 30 tablet 1 ? home oxygen - outpatient 3 L/min by Nasal route continuous. Including Portability and ConcentratorPt has been using 3L lately   ? levonorgestreL (MIRENA) IUD 1 each by Intrauterine route once.   ? mv,calcium,min/iron/folic/vitK (MULTI FOR HER ORAL)    ? nintedanib (OFEV) 150 mg capsule Take 1 capsule (150 mg total) by mouth every 12 (twelve) hours. Take with food and swallow whole with liquid. 60 capsule 11 ? omega-3s-dha-epa-fish oil (FISH OIL) 120 mg-180 mg- 60 mg-1,200 mg CpDR  (Patient not taking: Reported on 09/06/2021)   ?  omeprazole (PRILOSEC) 40 mg capsule TAKE 1 CAPSULE (40 MG TOTAL) BY MOUTH DAILY. 30 capsule 2 ? predniSONE (DELTASONE) 20 mg tablet TAKE 2 TABS BY MOUTH DAILY FOR 7 DAYS THEN 1 TAB DAILY FOR 30 DAYS TAKE WITH FOOD (Patient taking differently: Take 1 tablet (20 mg total) by mouth daily.) 90 tablet 1 ? predniSONE (DELTASONE) 5 mg tablet Take 3 tablets (15 mg total) by mouth daily for 14 days, THEN 2 tablets (10 mg total) daily. Take with food.. 140 tablet 1 ? sulfamethoxazole-trimethoprim (BACTRIM DS;CO-TRIMOXAZOLE DS) 800-160 mg per tablet Take 1 tablet by mouth 3 (three) times a week. 36 tablet 3 ? vitamin E 400 UNIT capsule    No facility-administered medications prior to visit. Allergies as of 11/10/2021 ? (No Known Allergies) Health Maintenance Topic Date Due ? Osteoporosis screening (bone density)  Never done ? Tetanus adult (Td q 10,TDAP once)  Never done ? Colon cancer screening, Colonoscopy  Never done ? Covid-19 vaccine series (3 - Pfizer risk series) 10/24/2019 ? Shingles vaccine (Shingrix) (1 of 2 - Shingrix (RZV) 2 Dose Standard Series) Never done ? Influenza vaccine  01/05/2022 ? Prediabetes Surveillance  04/19/2022 ? Breast cancer screening  07/13/2023 ? Diabetes screening  07/11/2024 ? Lipid disorder screening  04/19/2026 ? Cervical cancer screening (Pap Smear)  07/11/2026 ? Pneumococcal Vaccine  Completed ? Hepatitis C screening  Completed ? HIV screening  Completed Social History Socioeconomic History ? Marital status: Married Tobacco Use ? Smoking status: Never ? Smokeless tobacco: Never Vaping Use ? Vaping Use: Never used Substance and Sexual Activity ? Alcohol use: Never ? Drug use: Never   Objective: VitalsThere were no vitals taken for this visit.LabsLab Results Component Value Date  HGBA1C 5.7 (H) 04/19/2021 Lab Results Component Value Date  WBC 9.2 06/22/2021  HGB 13.2 06/22/2021  HCT 39.50 06/22/2021  PLT 359 06/22/2021  CHOL 230 (H) 04/19/2021  TRIG 109 04/19/2021  HDL 47 04/19/2021  ALT 10 16/02/9603  AST 21 07/11/2021  NA 141 07/11/2021  K 4.1 07/11/2021  CL 104 07/11/2021  CREATININE 0.59 07/11/2021  BUN 8 07/11/2021  CO2 27 07/11/2021  TSH 1.200 11/19/2020  INR 1.15 (H) 11/19/2020  GLU 133 (H) 07/11/2021  HGBA1C 5.7 (H) 04/19/2021 Pulmonary Functions Tests:No results found for: FEV1, FVC, FEV1FVC, TLC, DLCOAssessment/Plan: Medication ManagementPatient has fair adherence to prescribed medication regimen. Cost of medications is not a barrier. Patient is not experiencing any medication related side effects. Patient's medication list was reviewed and updated today in clinic. Patient provided with an updated list of her medications.Drug-drug interactions: none of clinical significance Renal dose adjustments: noneRecommend the following to improve/maintain adherence and optimize patient's medication regimen: use phone alarm for all medications to prevent missed doses ILD/PFMaria Roselind Watson is not currently on anti-fibrotic therapy for her pulmonary fibrosis. The purpose of our visit today was to discuss anti-fibrotic therapy with nintedanib (Ofev); full counseling was provided and patient verbalized understanding of risks versus benefits. Will send patient a mychart message with information on nintedanib (Ofev) for her reference and to review as well. Will pend orders for baseline and standing hepatic function panel to pulmonologist. Will also reach out to OPS to determine status of authorization. Regarding her famotidine, I discussed with her that it was added by her pulmonologist to see if it helps with her cough that reflux may be contributing to. She understood and will start taking this. Ofev (nintedanib) Monitoring - [facility] as she sometimes has labs drawn in VW:[id number] Parameter Recommended Frequency  Last Obtained Next Due LFTs Baseline, then monthly  for 3 months, then every 3 months thereafter 07/11/2021 - WNL  DUE NOW - baseline  Pulmonary Function Testing (PFTs) Baseline, then annually 08/21/2021 08/2022 Pregnancy testing (hCG)  Baseline, then as needed 11/09/2020 - negative N/A Recommend the following: ?	Continue azathioprine (Imuran) 75mg  tablet -- take 1 tablet by mouth twice daily ?	Continue prednisone 5mg  tablet -- currently on 15mg  dose until next week (taper: 15mg  x 2 weeks, 10mg  x 2 weeks, then reassess - consider 7.5mg  x 2 weeks, then 5 mg daily)?	Consider sulfamethoxaole/trimethoprim 800/160mg  tablet -- take 1 tablet every Monday, Wednesday and Friday ?	Start nintedanib (Ofev) 150mg  capsule - take 1 capsule by mouth twice daily  -- pending baseline labs and insurance approval/medication deliveryFollow-Up: in 5 week(s)Patient Education:  The following education was provided for: Initiation, modification, or discontinuation of drug therapy, Disease state and/or lifestyle education per LIP order and New caregiver education during today?s visit to the patient regarding medications and ILD/PF management: proper administration of medications, purpose of each medication, proper storage of medications, side effects of medications and importance of adherence with the regimen(s)The following counseling was provided to the patient regarding nintedanib (Ofev):Medication side effects (and mitigation):?	GI issues (nausea, vomiting, and diarrhea) - take with food to mitigate stomach upset. Call the clinic if GI symptoms become intolerable since a dose reduction may be warranted?	Weight loss - take with food. Weight loss is most commonly due to GI issues?	Increase liver function tests and rare risk for hepatotoxicity - regularly monitor your liver function tests to catch issues before they arise?	Nasopharyngitis - use a vaporizer or humidifier to moisten the air; stay hydrated; over-the-counter products such as decongestants or analgesics such as acetaminophen?	Fatigue?	Bruising/bleeding - avoid other over-the-counter products that can thin the blood like NSAIDs (ex. Advil)Patient provided with the clinic phone number and instructed to contact the pharmacist if they experience any side effects.Missed dose instructions:?	Take a dose as soon as you remember. If it is almost time for your next dose, wait until then and take a regular dose. Do not take extra medicine to make up for a missed doseStorage instructions:?	Store at room temperature 20?C to 25?C (68?F to 77?F); excursions permitted between 15?C and 30?C (59?F and 86?F) ?	Protect from high humidity and avoid excessive heatDietary Recommendations ?	Ofev can cause gastrointestinal side effects such as diarrhea, nausea and vomiting. ?	To best prevent these side effects from occurring, it is recommended to eat certain foods that are easier on your stomach, and avoid others that can make symptoms worse. ?	A full stomach may make it harder to breathe, so it is recommended to eat smaller, more frequent meals. ?	Below are some helpful do's and don'ts when ut comes to your diet while taking Ofev.?	Do eat:o	Meals and snacks-	Broiled or baked (not fried) chicken without the skin-	Clear chicken, beef, or vegetable broth-	[place] or oatmeal-	Pasta or white rice-	White toast, crackers, or pretzels-	Peeled potatoes, boiledo	Fruits and sweets-	Bananas or canned fruit such as applesauce, peaches, or pears-	Gelatin (Jello-O)-	Popsicles or sherbet-	Yogurto	Fluids to prevent dehydration-	Water-	Sports drinks with electrolytes-	Clear soda such as ginger ale-	Cranberry or grape juice-	Clear, decaffeinated tea?	Don't eat:o	Spicy foodso	High-fiber foodso	Fried or greasy foodso	Alcohol or caffeineo	Sugar-free candy made with alcohols, such as xylitol o	Milk or milk products Time Spent: 60 minutes, telehealth consultPATIENT AND/OR CAREGIVER VERBALIZED UNDERSTANDING OF CARE PLAN: yesPATIENT ADVISED TO CALL BACK WITH QUESTIONS, CONCERNS, OR CHANGE IN SYMPTOMS.Pharmacy Telehealth:   VIDEO TELEHEALTH VISIT: This clinician is part of the telehealth program and is conducting this visit in a currently approved location. For this visit the clinician and patient were  present via interactive audio & video telecommunications system that permits real-time communications, via the [employer].Patient's use of the telehealth platform followed consent and acknowledges agreement to permit telehealth for this visit. State patient is located in: [place] clinician is appropriately licensed in the above state to provide care for this visit. Other individuals present during the telehealth encounter and their role/relation: noneBecause this visit was completed over video, a hands-on physical exam was not performed. Patient/parent or guardian understands and knows to call back if condition changes. Ander Purpura, PharmD Clinical Pharmacist8:28 AM 11/10/2021

## 2021-11-14 ENCOUNTER — Encounter: Admit: 2021-11-14 | Payer: PRIVATE HEALTH INSURANCE

## 2021-11-14 MED ORDER — AZATHIOPRINE 75 MG TABLET
75 | ORAL_TABLET | Freq: Two times a day (BID) | ORAL | 3 refills | 90.00000 days | Status: AC
Start: 2021-11-14 — End: ?

## 2021-11-17 NOTE — Progress Notes
na

## 2021-11-24 ENCOUNTER — Encounter: Admit: 2021-11-24 | Payer: PRIVATE HEALTH INSURANCE

## 2021-11-24 NOTE — Progress Notes
07/21/23To whom it may concern,I am writing this letter of medical necessity on behalf of: Name: Teresa Watson DOB: [birth year]Reference number: Episode of coverage number = [id number] Medication: Ofev (nintedanib) 150mg  every 12 hoursDate of Service: 7/18/23Member ID: [id number] Teresa Watson has been a patient at the [facility] under the care of pulmonologist Dr. Stark Klein.  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	Patient's disease is so severe that she requires supplemental oxygen (between 2L and 4L/min) upon exertion or else her oxygen saturation drops to dangerous levels (at or below 85%).?	This patient has never smoked and will not smoke cigarettes while taking Ofev.?	The patient has significant radiographic signs of their disease. A high resolution computer tomography scan from 09/23/21 showed this patient has ILD [place] chest wo contrastOrder: [id number]	Status: Edited  ?	Visible to patient: Yes (seen)  ?	Next appt: 12/05/2021 at 09:30 AM in Rheumatology Sallye Ober Dumitrescu, MD)  ?	Dx: ILD (interstitial lung disease) (HC C...  ?	0 Result NotesFindings Acuity Comment Other (see report) Yellow - Notification Required  DetailsReading Physician Reading Date Result Priority Marcell Anger, [phone] 09/25/2021  Narrative & ImpressionCT CHEST WO IV CONTRAST Date: 09/23/2021 1:53 PM?INDICATION: ild and now on prednisone and azathioprine see if improved.?COMPARISON: CTA CHEST (PE) W IV CONTRAST 2023-Feb-16; CTA CHEST (PE) W IV CONTRAST 2022-Nov-06?TECHNIQUE:  A volumetric [place] acquisition of the chest was obtained from the thoracic inlet to the upper abdomen without administration of intravenous contrast. Coronal and sagittal reformatted images were also provided.?Inspiratory and expiratory images were performed and are adequate.?FINDINGS:?Lungs/Airways/Pleura: There are bilateral reticular and groundglass opacities associated with traction bronchiectasis with a basilar and peripheral predominance but also involving central peribronchial vascular and upper lungs. There is no honeycombing.No air trapping on expiratory images. In comparison with June 22, 2021 there is mild increase in reticulation. Groundglass opacities, reticulations and traction bronchiectasis have progressed considerably since March 12, 2021.?Mediastinum/Lymph nodes: Multiple subcentimeter and mildly enlarged bilateral mediastinal and hilar lymph nodes without change. There is thymic hyperplasia. The esophagus is patulous.?Heart and Vessels: The heart is mildly enlarged. No coronary artery calcification. No pericardial effusion. 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 When comparing the decline in forced vital capacity (FVC) over the course of 1 year, the ninetanib group saw a drop in FVC of 80.87mL after 1 year while the placebo group saw a drop in FVC of 187.16mL after 1 year (difference = per year; 95% confidence interval [CI] = 65.4 to 148.5; P-value = <0.001). Since decline in FVC is associated with acute exacerbations of ILD, slowing the decline in [facility] is believed to slow the progression of their disease, prevent their symptoms from worsening, and preventing hospitalizations and death from their disease.References:1.	Raford Pitcher, Wells AU, Cottin V, Devaraj A, Walsh SLF, [place] Y, Richeldi L, Kolb M, [address] K, Stowasser S, Coeck C, Cler[place] E, [place] B, Quaresma M, [place] T, Goeldner RG, Schlenker-Herceg R, [place]; INBUILD Trial Investigators. Nintedanib in Progressive Fibrosing Interstitial Lung Diseases. Malva Limes Med. 2019 Oct 31;381(18):1718-1727. doi: 10.1056/[id number]. Epub 2019 Sep  29. PMID: 16109604. Link: http://collins.biz/.	Hessie Diener, Ryerson CJ, [place]. Progression of fibrosing interstitial lung disease. Respir Res. 2020 Jan 29;21(1):32. doi: 10.1186/[id number]. PMID: 54098119; PMCID: [id number]. Link: LegalPaid.ch there is compelling evidence to use Ofev in the way prescribed by the patient's pulmonologist, and our patient falls into the same category of patients who benefited from the previously mentioned trial. We at the clinic would appreciate it if this was reviewed as soon as possible to avoid delays in the patient's care. Thank you for your prompt attention to this matter. Sincerely, Asencion Partridge, PharmD - writing on behalf of Dr. Stark Klein, MDY[facility] [address] [place], [place] [address]HQION Number = [phone] Number = [phone]

## 2021-11-27 ENCOUNTER — Encounter: Admit: 2021-11-27 | Payer: PRIVATE HEALTH INSURANCE

## 2021-11-29 ENCOUNTER — Encounter: Admit: 2021-11-29 | Payer: PRIVATE HEALTH INSURANCE | Attending: Pulmonary Disease

## 2021-12-05 ENCOUNTER — Encounter: Admit: 2021-12-05 | Payer: PRIVATE HEALTH INSURANCE

## 2021-12-05 ENCOUNTER — Encounter: Admit: 2021-12-05 | Payer: BLUE CROSS/BLUE SHIELD

## 2021-12-08 ENCOUNTER — Encounter: Admit: 2021-12-08 | Payer: PRIVATE HEALTH INSURANCE

## 2021-12-08 DIAGNOSIS — J8410 Pulmonary fibrosis, unspecified: Secondary | ICD-10-CM

## 2021-12-08 DIAGNOSIS — J849 Interstitial pulmonary disease, unspecified: Secondary | ICD-10-CM

## 2021-12-14 ENCOUNTER — Encounter: Admit: 2021-12-14 | Payer: PRIVATE HEALTH INSURANCE | Attending: Critical Care Medicine

## 2021-12-14 ENCOUNTER — Encounter: Admit: 2021-12-14 | Payer: PRIVATE HEALTH INSURANCE

## 2021-12-21 ENCOUNTER — Ambulatory Visit: Admit: 2021-12-21 | Payer: BLUE CROSS/BLUE SHIELD

## 2021-12-22 ENCOUNTER — Encounter: Admit: 2021-12-22 | Payer: PRIVATE HEALTH INSURANCE

## 2021-12-22 ENCOUNTER — Telehealth: Admit: 2021-12-22 | Payer: PRIVATE HEALTH INSURANCE

## 2021-12-22 NOTE — Telephone Encounter
PHARMACY NOTE: SCHEDULING MISSED CALL 	I have left a voicemail for Ms. Teresa Watson on 12/22/2021 at 9:51 AM  to schedule a Follow-up appointment with [facility] pharmacist. This is the first call attempt.When patient returns telephone call, please schedule as follows: Appt Type: Phone Consult or MyChart Video visitPharmacist: Mike Craze reason/Notes:Medication name: OfevFollow-up AppointmentPertinent notes from referral: No show 8/17Order Priority: Routine Duration of Appt: 30 minutesErica Gwendolynn Merkey, CPHTMedication Management Clinic [facility] Phone: [phone]

## 2021-12-28 ENCOUNTER — Ambulatory Visit: Admit: 2021-12-28 | Payer: BLUE CROSS/BLUE SHIELD

## 2021-12-28 ENCOUNTER — Encounter: Admit: 2021-12-28 | Payer: PRIVATE HEALTH INSURANCE | Attending: Critical Care Medicine

## 2021-12-28 DIAGNOSIS — K21 Gastroesophageal reflux disease with esophagitis, unspecified whether hemorrhage: Secondary | ICD-10-CM

## 2021-12-28 MED ORDER — FAMOTIDINE 20 MG TABLET
20 mg | ORAL_TABLET | Freq: Every evening | ORAL | 4 refills | Status: AC
Start: 2021-12-28 — End: 2022-04-20

## 2021-12-28 NOTE — Telephone Encounter
[facility] Prescription Refill RequestPrescription Requested:  Prescription Refill Received by:[]  patient called or mychart message [x]  direct request from pharmacy (interface)[]  otherThe following was confirmed:[x]  [facility] practitioner is the primary prescriber of this medication and is the correct prescriber[]  Safety labs completed within 3 months if applicable[x]  patient requires refill[x]  dosage is correct[x]  pharmacy is correct[x]  Refill appropriate ( ie. Patient has had an appt in the past year or has an upcoming appt in the next 90 days, patient is due for controlled substance)) The information was confirmed by the following:[x]  review of patient chart/last note[]  direct communication with patientComments:

## 2021-12-29 ENCOUNTER — Encounter: Admit: 2021-12-29 | Payer: PRIVATE HEALTH INSURANCE

## 2022-01-05 ENCOUNTER — Encounter: Admit: 2022-01-05 | Payer: PRIVATE HEALTH INSURANCE

## 2022-01-05 DIAGNOSIS — J849 Interstitial pulmonary disease, unspecified: Secondary | ICD-10-CM

## 2022-01-05 DIAGNOSIS — J8410 Pulmonary fibrosis, unspecified: Secondary | ICD-10-CM

## 2022-01-10 ENCOUNTER — Telehealth: Admit: 2022-01-10 | Payer: PRIVATE HEALTH INSURANCE

## 2022-01-10 NOTE — Telephone Encounter
Patient requested me to call her regarding approval of nintedanib (Ofev). She states she received a letter from [facility] that it was approved. 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 She states the goal is to get her off the list for transplant as she does not need one now, but they want to treat her for now to see if they can prevent a transplant in the future.This Friday, she is going to the hospital to do another treatment of the lung transplant medicine and then she will do another treatment in 6 months, and she is unsure if she is not supposed to start any new medicines in this 6 months. She will confirm with them this Friday when she goes for her second treatment.Will let ILD team know. Will hold off on nintedanib (Ofev) for now until we know further information. Ander Purpura, PharmDAmbulatory Care Clinical Pharmacist II - PulmonologyWinchester Center for Lung Disease

## 2022-01-19 ENCOUNTER — Encounter: Admit: 2022-01-19 | Payer: PRIVATE HEALTH INSURANCE

## 2022-01-24 ENCOUNTER — Encounter: Admit: 2022-01-24 | Payer: PRIVATE HEALTH INSURANCE | Attending: Critical Care Medicine

## 2022-01-25 ENCOUNTER — Ambulatory Visit: Admit: 2022-01-25 | Payer: BLUE CROSS/BLUE SHIELD | Attending: Pulmonary Disease

## 2022-01-30 IMAGING — MR CRANIO ENCEFALO
6 of 19 series · 27 of 48 positions shown · non-contrast
Comparison: none

[Series 5001: t2_space_dark-fluid_sag_p2_ns-ir · sagittal · 1.0mm · 0.98mm/px · 5 of 192 slices shown]
[im 1/192]
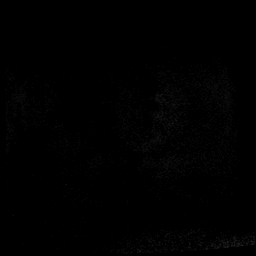
[im 48/192]
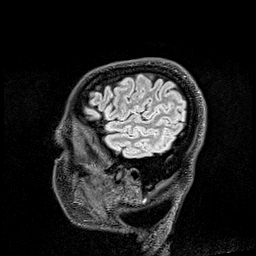
[im 96/192]
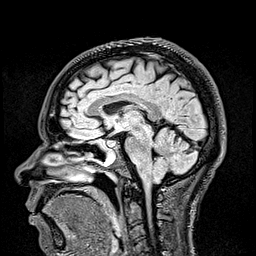
[im 144/192]
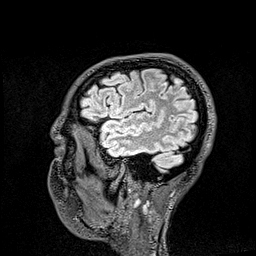
[im 192/192]
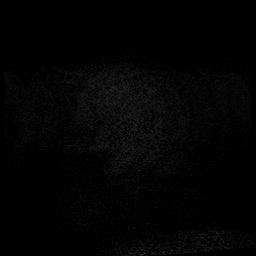

[Series 6001: t2_space_dark-fluid_sag_p2_ns-ir_mpr_cor · coronal · 1.0mm · 0.43mm/px · 4 of 160 slices shown]
[im 1/160]
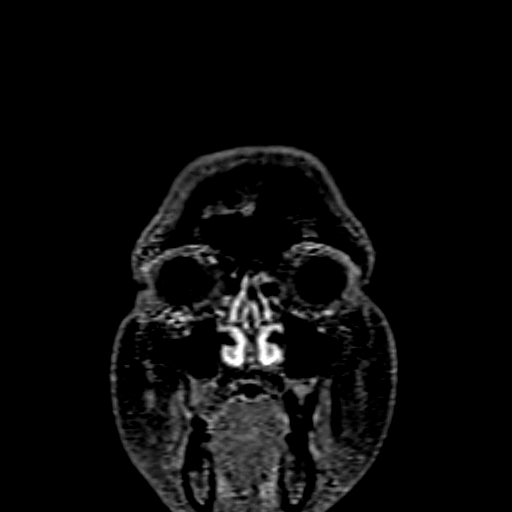
[im 54/160]
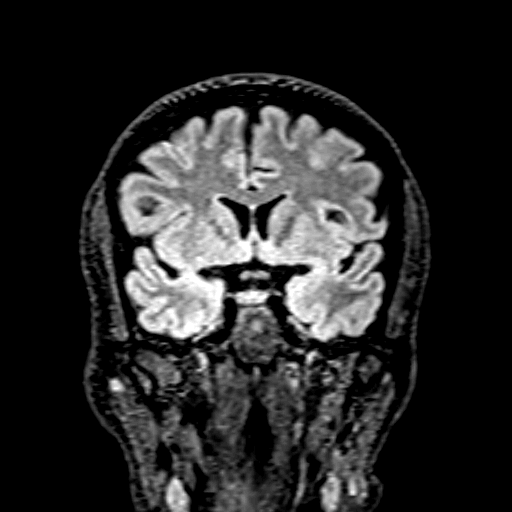
[im 107/160]
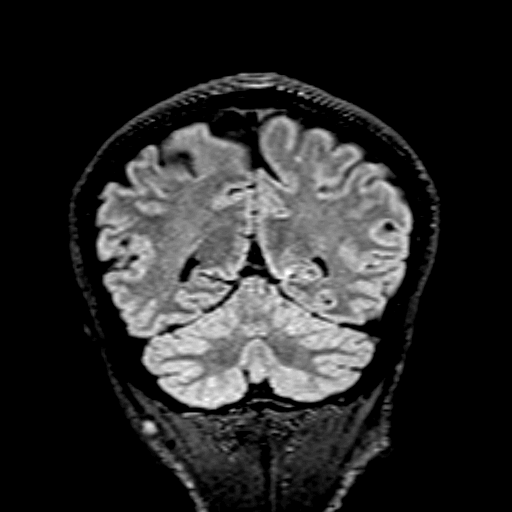
[im 160/160]
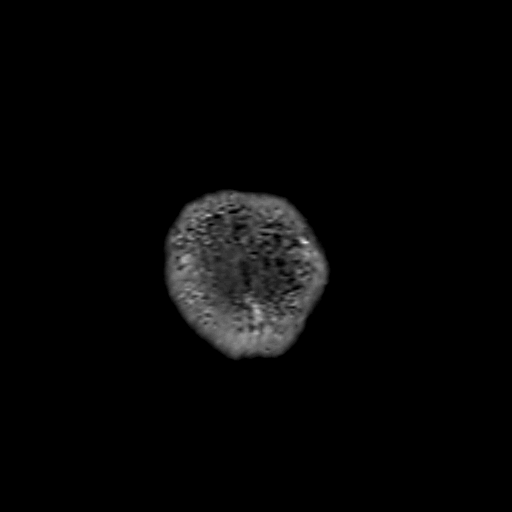

[Series 7001: t2_space_dark-fluid_sag_p2_ns-ir_mpr_axi · axial · 1.0mm · 0.45mm/px · z∈[-64,-11]mm · 2 of 160 slices shown]
[im 1/160]
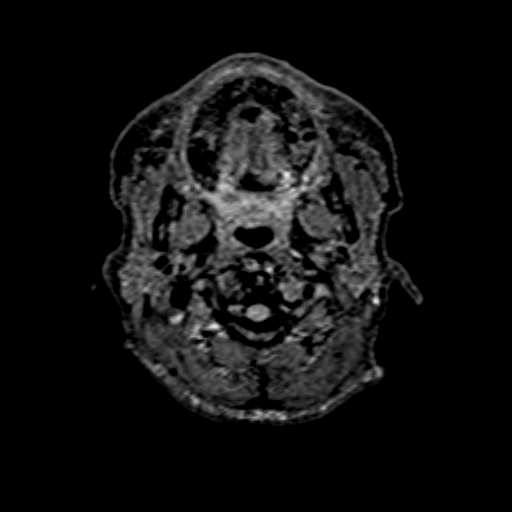
[im 54/160]
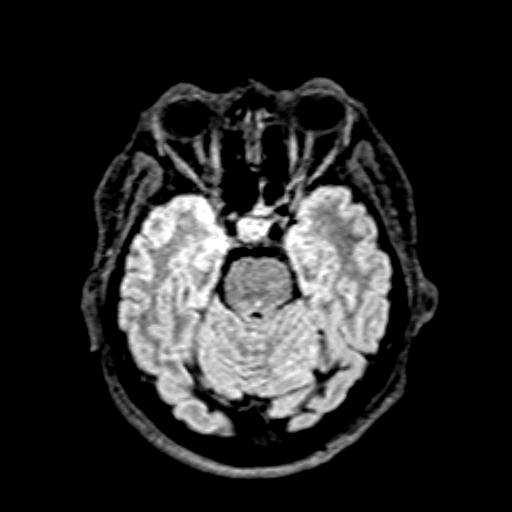

[T1 · axial · 1.0mm · 1.00mm/px · z∈[-79,+112]mm · 6 of 192 slices shown (1 of 3)]
[im 1/192]
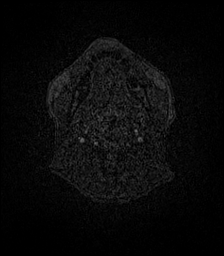
[im 39/192]
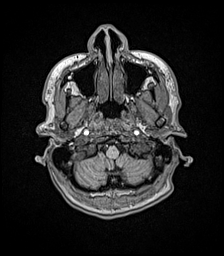
[im 77/192]
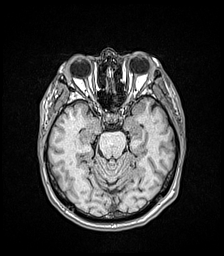
[im 115/192]
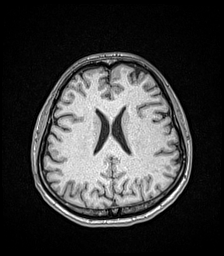
[im 153/192]
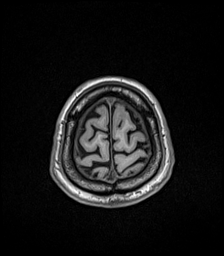
[im 192/192]
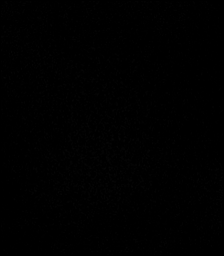

[T1 · sagittal · 1.0mm · 0.45mm/px · 5 of 160 slices shown (2 of 3)]
[im 1/160]
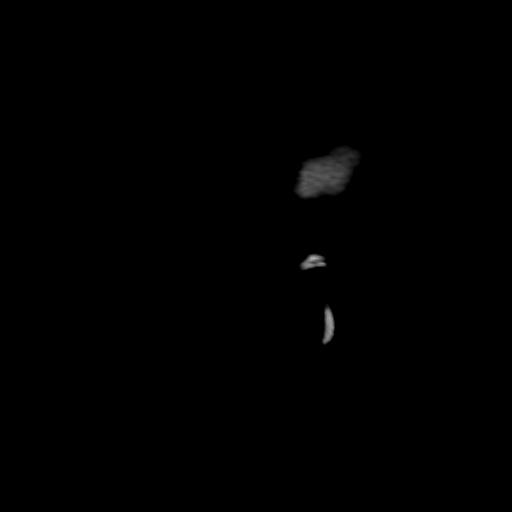
[im 40/160]
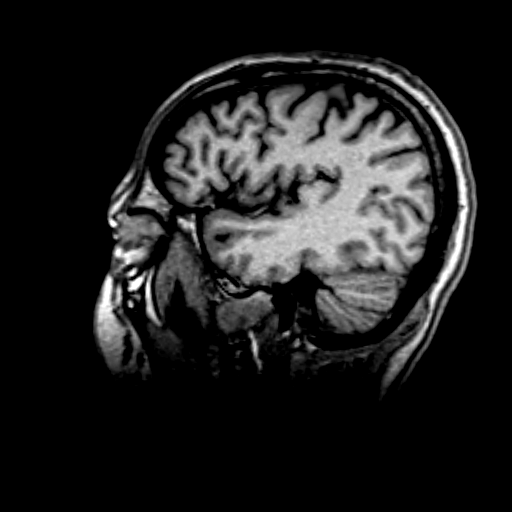
[im 80/160]
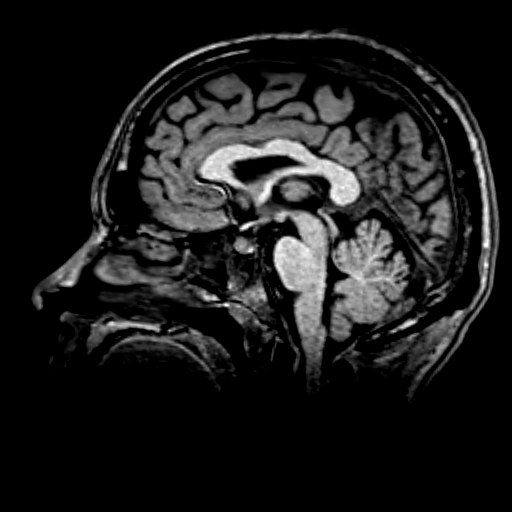
[im 120/160]
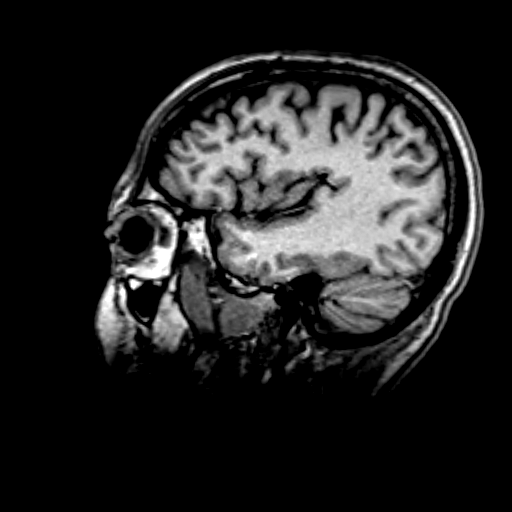
[im 160/160]
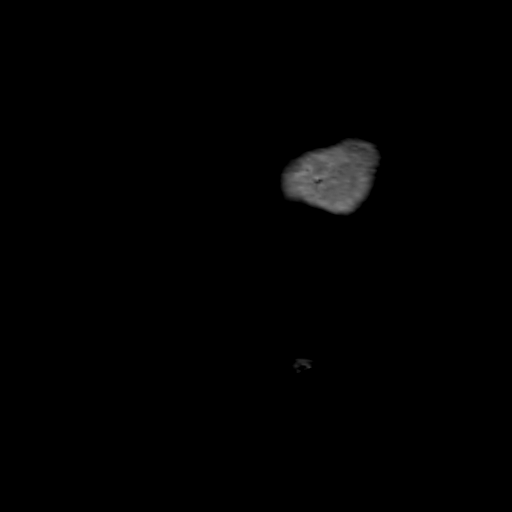

[T1 · coronal · 1.0mm · 0.43mm/px · 5 of 160 slices shown (3 of 3)]
[im 1/160]
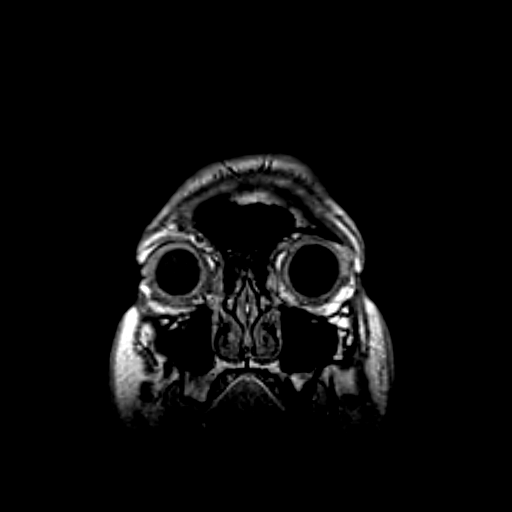
[im 40/160]
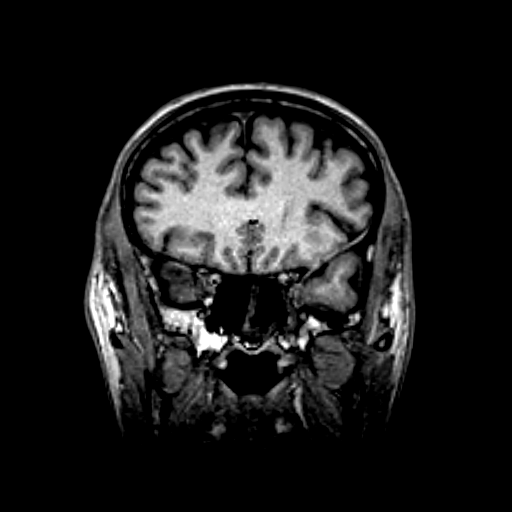
[im 80/160]
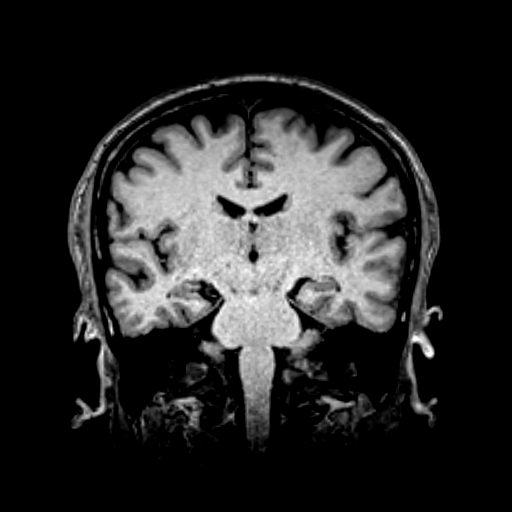
[im 120/160]
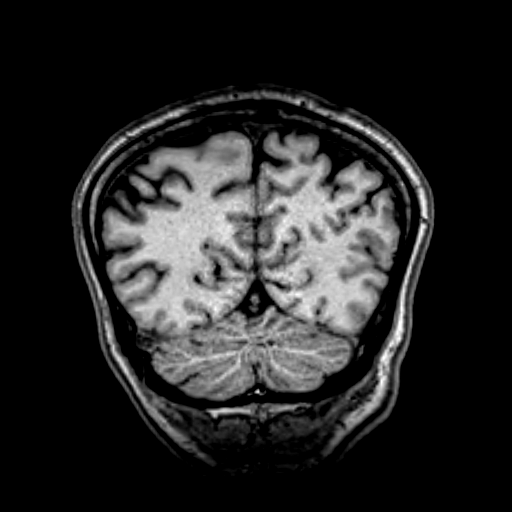
[im 160/160]
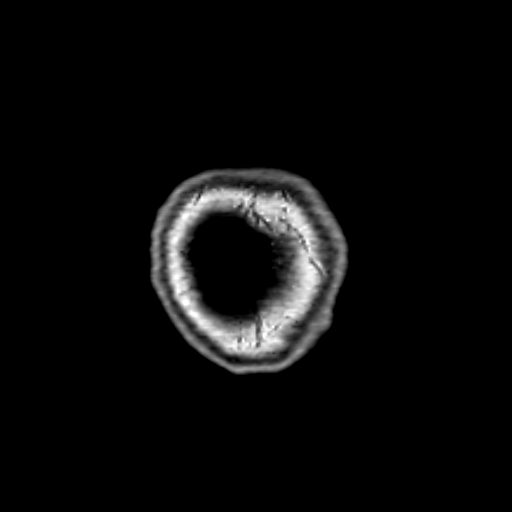

[27 of 48 positions shown; findings below may reference images not displayed]

METODOLOGIA:
 Exame realizado com sequências SE (spin-echo), FSE (fast spin-eco), GR (gradiente-eco) e FSE-IR (FLAIR),
em planos de cortes múltiplos, sem a administração intravenosa do agente de contraste paramagnético.
ANÁLISE:
Não há evidência de hemorragia intraparenquimatosa aguda/subaguda, coleções liquidas extraxiais, desvio
das estruturas da linha média ou apagamento das cisternas da base.
RESSONÂNCIA MAGNÉTICA DO ENCÉFALO
O sistema ventricular supratentorial tem topografia, morfologia e dimensões normais.
Mínima ectopia baixa das tonsilas cerebelares.
Ausência de áreas de restrição à difusão de água.
Não há evidência de alterações na sequencia gradiente -echo.
Fluxo habitual ao nível das grandes artérias do sistema vertebro basilar e carotídeo, segundo os critérios spin
eco.
O parênquima encefálico apresenta morfologia e intensidade de sinal preservadas.
IMPRESSÃO DIAGNÓSTICA:
Avaliação por ressonância magnética do encéfalo sem alterações significativas.
Obs: sinusopatia esfenoidal à esquerda aguda com espessamento do revestimento mucoso e acúmulo de
secreção.
[place]ondereye Cherissa

## 2022-01-30 IMAGING — MR [facility] CERVICAL
4 of 7 series · 13 of 48 positions shown · non-contrast
Comparison: none

[Series 2001: sag_t2_qtse · sagittal · 3.0mm · 0.31mm/px · 4 of 17 slices shown]
[im 1/17]
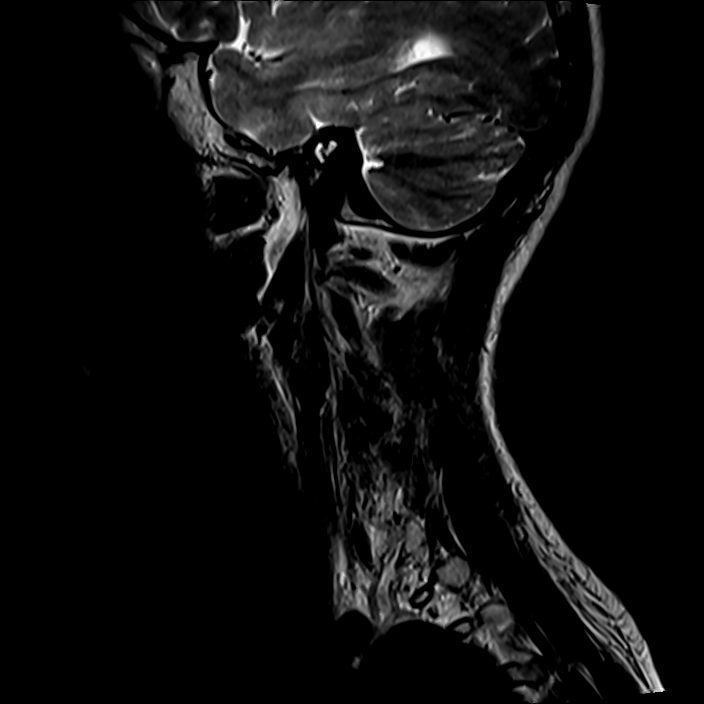
[im 5/17]
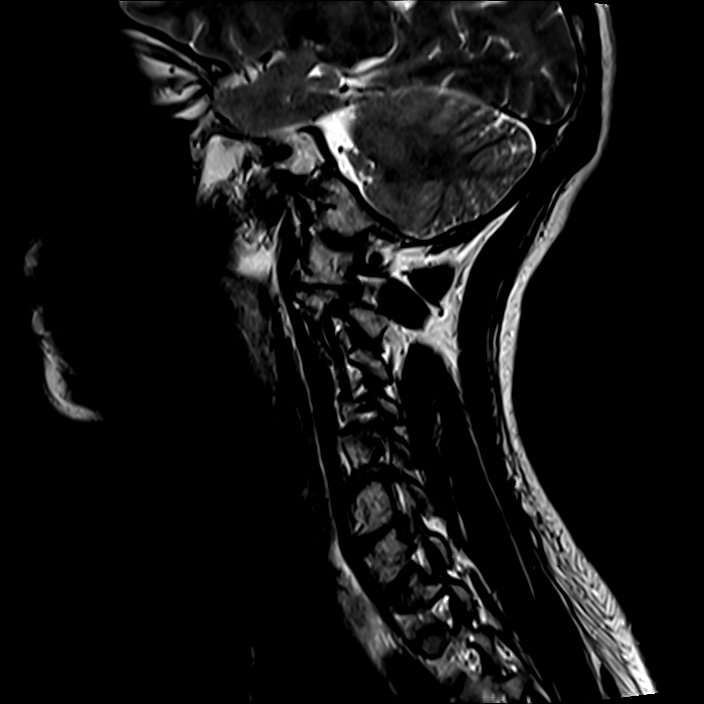
[im 9/17]
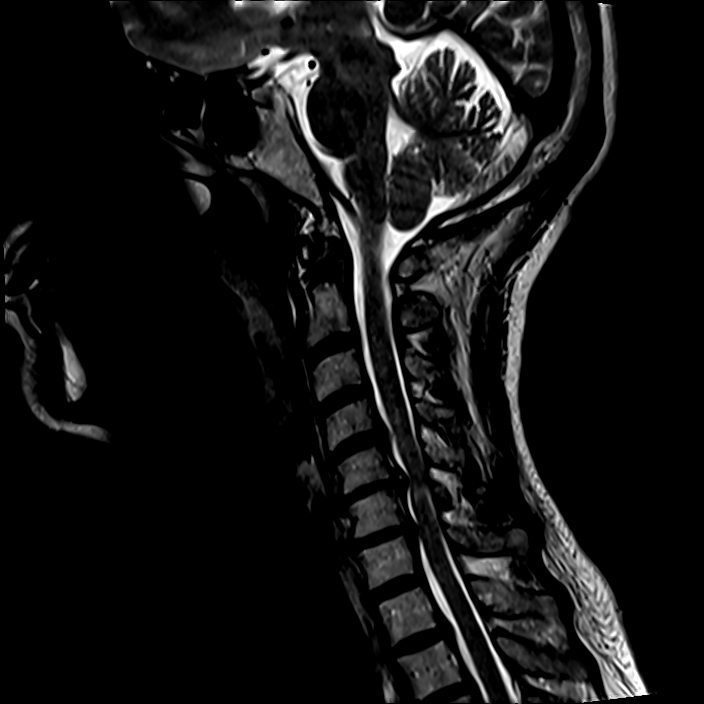
[im 17/17]
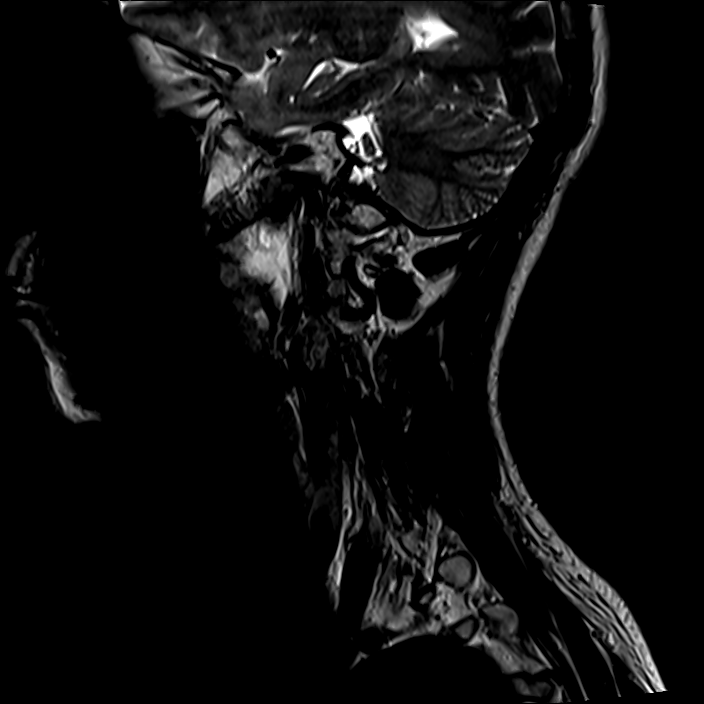

[Series 3001: sag_stir_qtse · sagittal · 3.0mm · 0.36mm/px · 3 of 17 slices shown]
[im 1/17]
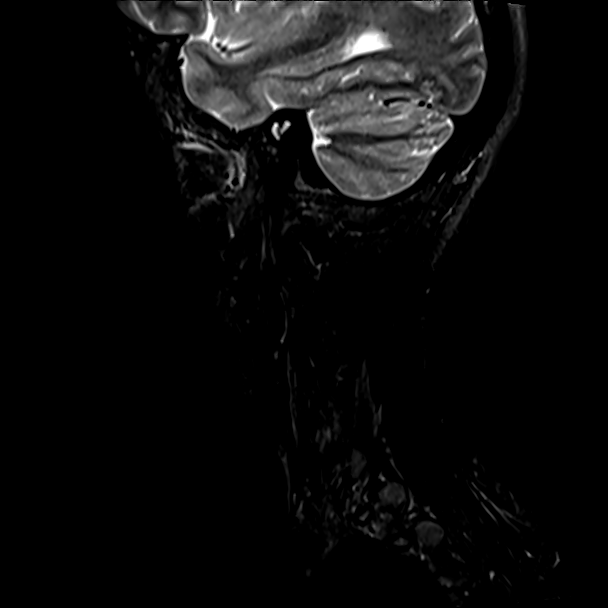
[im 9/17]
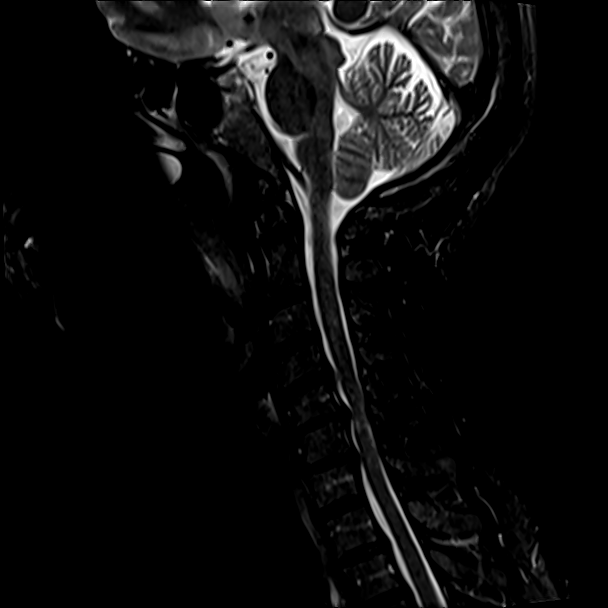
[im 17/17]
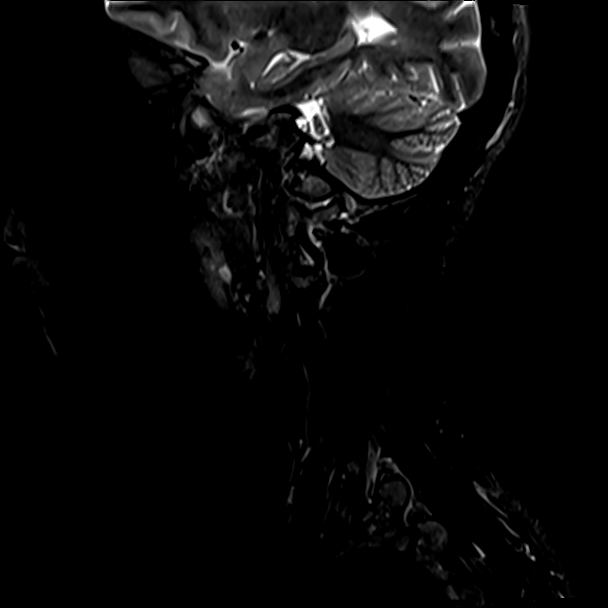

[Series 4001: t2_tse_tra_p2 · axial · 3.0mm · 0.33mm/px · z∈[-67,+3]mm · 3 of 28 slices shown]
[im 4/28]
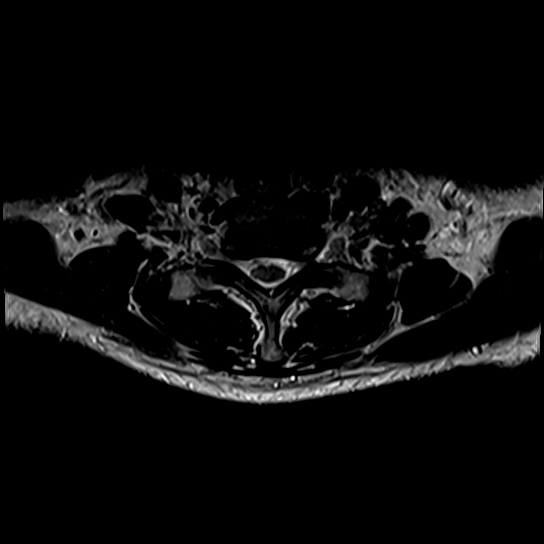
[im 16/28]
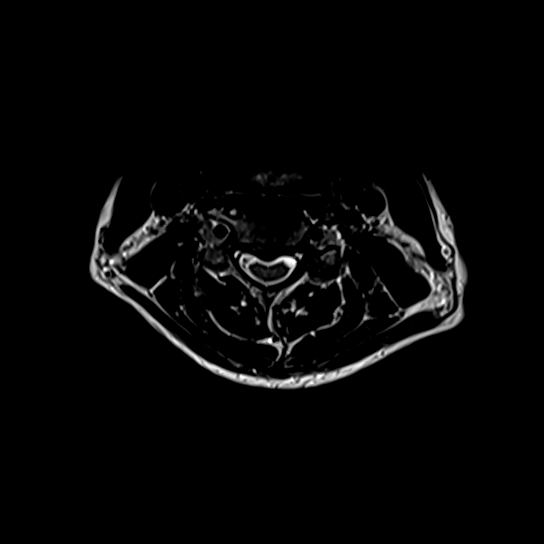
[im 25/28]
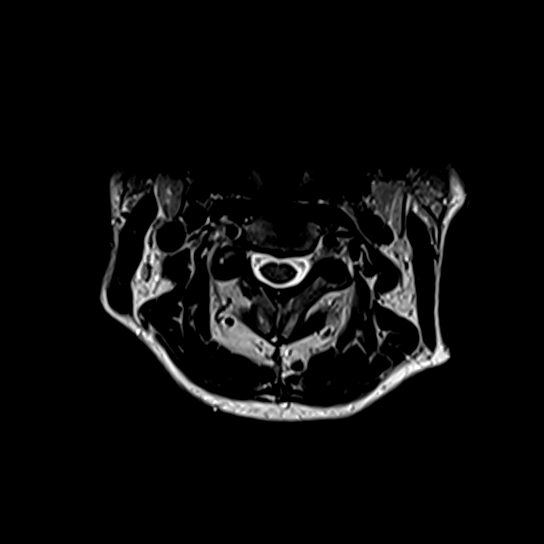

[Series 5001: t2_me2d_tra · axial · 3.0mm · 0.35mm/px · z∈[-67,+3]mm · 3 of 28 slices shown]
[im 4/28]
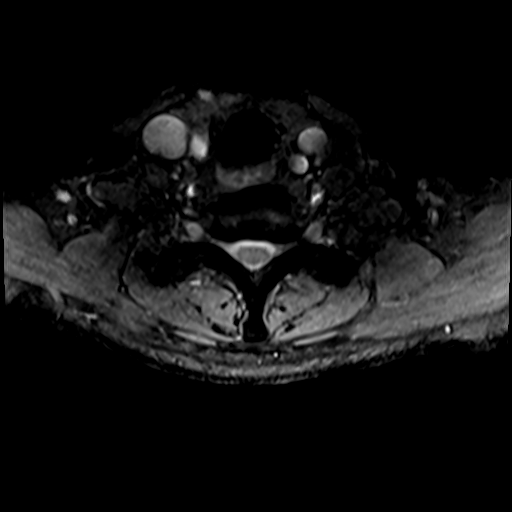
[im 16/28]
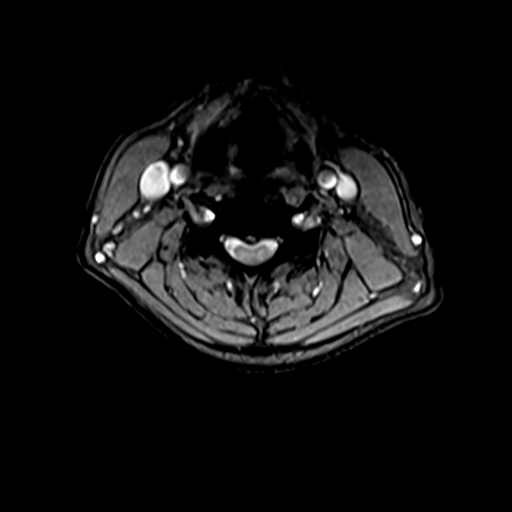
[im 25/28]
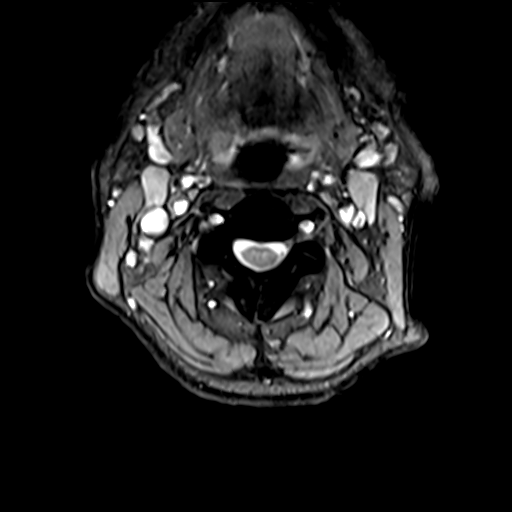

[13 of 48 positions shown; findings below may reference images not displayed]

METODOLOGIA:
Exame realizado com sequências ponderadas em T1 e T2, sem a administração intravenosa do agente de
contraste paramagnético.
ANÁLISE:
Transição crânio-vertebral sem anormalidades significativas.
[facility] CERV[facility]
Retificação da lordose cervical fisiológica na posição de exame .
Corpos vertebrais com altura mantidas.
Desidratação discal difusa com  redução dos espaços discais, mais evidente em C5-C6 e C6-C7 com
irregularidades e esclerose subcondrais com alterações degenerativas.
Osteofitose.
Componente discal protruso posterior e central com rotura de fibras do ânulo fibroso em C4-C5.
Componente discal protruso de base larga associado a complexo disco-osteofitário e rotura de fibras do ânulo
fibroso em C5-C6 que apaga a coluna liquórica anterior e comprime a face ventral do saco dural.
Componente discais protrusos posteriores e centrais em C6-C7 que apaga uma coluna liquórica anterior e
comprime a face ventral do saco dural.
Alterações degenerativas incipientes das uncovertebrais e interfacetárias.
Redução das dimensões dos forames de conjugações em C4-C5 bilateral mais evidente à esquerda, C5-C6 e
C6-C7 bilateral devido a componentes discais protrusos, hipertrofia das uncovertebrais e complexos
discosteofitários, com íntimo contato radicular.
Medula espinhal sem anormalidades detectáveis ao método.
Shalley Jim
Mínima lipossubstituição da musculatura paravertebral posterior.
IMPRESSÃO DIAGNÓSTICA:
- Desidratação discal difusa com  redução dos espaços discais, mais evidente em C5-C6 e C6-C7 com
irregularidades e esclerose subcondrais com alterações degenerativas.
- Componente discal protruso posterior e central com rotura de fibras do ânulo fibroso em C4-C5.
- Componente discal protruso de base larga associado a complexo disco-osteofitário e rotura de fibras do
ânulo fibroso em C5-C6 que apaga a coluna liquórica anterior e comprime a face ventral do saco dural.
- Componente discais protrusos posteriores e centrais em C6-C7 que apaga uma coluna liquórica anterior e
comprime a face ventral do saco dural.
- Alterações degenerativas incipientes das uncovertebrais e interfacetárias.
- Redução das dimensões dos forames de conjugações em C4-C5 bilateral mais evidente à esquerda, C5-C6 e
C6-C7 bilateral devido a componentes discais protrusos, hipertrofia das uncovertebrais e complexos
discosteofitários, com íntimo contato radicular.
IMPRESSÃO DIAGNÓSTICA:
- Espondilodiscopatia cervical com foraminopatia difusas.
- Destaca-se componente discal protruso posterior e central em C3-C4 que comprime a face ventral do saco
dural.
Shalley Jim

## 2022-02-02 ENCOUNTER — Encounter: Admit: 2022-02-02 | Payer: PRIVATE HEALTH INSURANCE

## 2022-02-02 DIAGNOSIS — J8410 Pulmonary fibrosis, unspecified: Secondary | ICD-10-CM

## 2022-02-02 DIAGNOSIS — J849 Interstitial pulmonary disease, unspecified: Secondary | ICD-10-CM

## 2022-02-07 ENCOUNTER — Encounter: Admit: 2022-02-07 | Payer: BLUE CROSS/BLUE SHIELD

## 2022-02-09 ENCOUNTER — Encounter: Admit: 2022-02-09 | Payer: BLUE CROSS/BLUE SHIELD | Attending: Neurology

## 2022-02-23 ENCOUNTER — Encounter: Admit: 2022-02-23 | Payer: BLUE CROSS/BLUE SHIELD | Attending: Neurology

## 2022-04-19 ENCOUNTER — Encounter
Admit: 2022-04-19 | Payer: PRIVATE HEALTH INSURANCE | Attending: Student in an Organized Health Care Education/Training Program

## 2022-04-19 DIAGNOSIS — K21 Gastroesophageal reflux disease with esophagitis, unspecified whether hemorrhage: Secondary | ICD-10-CM

## 2022-04-19 NOTE — Telephone Encounter
[facility] Prescription Refill RequestPrescription Requested:  PepcidPrescription Refill Received by:[]  patient called or mychart message [x]  direct request from pharmacy (interface)[]  otherThe following was confirmed:[x]  [facility] practitioner is the primary prescriber of this medication and is the correct prescriber[]  Safety labs completed within 3 months if applicable[]  patient requires refill[x]  dosage is correct[x]  pharmacy is correct[x]  Refill appropriate ( ie. Patient has had an appt in the past year or has an upcoming appt in the next 90 days, patient is due for controlled substance)) The information was confirmed by the following:[x]  review of patient chart/last note[]  direct communication with patientComments: per last OV note, continue current PPI; reflux precautions suggested

## 2022-04-20 MED ORDER — FAMOTIDINE 20 MG TABLET
20 mg | ORAL_TABLET | Freq: Every evening | ORAL | 6 refills | Status: AC
Start: 2022-04-20 — End: ?

## 2022-07-18 ENCOUNTER — Encounter: Admit: 2022-07-18 | Payer: BLUE CROSS/BLUE SHIELD

## 2022-07-18 ENCOUNTER — Encounter: Admit: 2022-07-18 | Payer: PRIVATE HEALTH INSURANCE

## 2022-12-10 ENCOUNTER — Encounter: Admit: 2022-12-10 | Payer: PRIVATE HEALTH INSURANCE | Attending: Critical Care Medicine

## 2022-12-23 ENCOUNTER — Encounter: Admit: 2022-12-23 | Payer: PRIVATE HEALTH INSURANCE | Attending: Critical Care Medicine

## 2022-12-23 DIAGNOSIS — K21 Gastroesophageal reflux disease with esophagitis, unspecified whether hemorrhage: Secondary | ICD-10-CM
